# Patient Record
Sex: Female | Born: 1955 | Race: White | Hispanic: No | Marital: Married | Smoking: Former smoker
Health system: Southern US, Community
[De-identification: ages and names within clinical notes are randomized; demographics above are authoritative.]

## PROBLEM LIST (undated history)

## (undated) DIAGNOSIS — R51 Headache: Secondary | ICD-10-CM

## (undated) DIAGNOSIS — G40219 Localization-related (focal) (partial) symptomatic epilepsy and epileptic syndromes with complex partial seizures, intractable, without status epilepticus: Secondary | ICD-10-CM

## (undated) DIAGNOSIS — E079 Disorder of thyroid, unspecified: Secondary | ICD-10-CM

## (undated) DIAGNOSIS — M069 Rheumatoid arthritis, unspecified: Secondary | ICD-10-CM

## (undated) DIAGNOSIS — G40209 Localization-related (focal) (partial) symptomatic epilepsy and epileptic syndromes with complex partial seizures, not intractable, without status epilepticus: Secondary | ICD-10-CM

## (undated) DIAGNOSIS — H04129 Dry eye syndrome of unspecified lacrimal gland: Secondary | ICD-10-CM

## (undated) DIAGNOSIS — M1990 Unspecified osteoarthritis, unspecified site: Secondary | ICD-10-CM

## (undated) DIAGNOSIS — F419 Anxiety disorder, unspecified: Secondary | ICD-10-CM

## (undated) DIAGNOSIS — M329 Systemic lupus erythematosus, unspecified: Secondary | ICD-10-CM

## (undated) DIAGNOSIS — M797 Fibromyalgia: Secondary | ICD-10-CM

## (undated) DIAGNOSIS — F32A Depression, unspecified: Secondary | ICD-10-CM

## (undated) DIAGNOSIS — I10 Essential (primary) hypertension: Secondary | ICD-10-CM

## (undated) DIAGNOSIS — F329 Major depressive disorder, single episode, unspecified: Secondary | ICD-10-CM

## (undated) HISTORY — DX: Major depressive disorder, single episode, unspecified: F32.9

## (undated) HISTORY — DX: Localization-related (focal) (partial) symptomatic epilepsy and epileptic syndromes with complex partial seizures, not intractable, without status epilepticus: G40.209

## (undated) HISTORY — DX: Headache: R51

## (undated) HISTORY — PX: BACK SURGERY: SHX140

## (undated) HISTORY — DX: Essential (primary) hypertension: I10

## (undated) HISTORY — DX: Anxiety disorder, unspecified: F41.9

## (undated) HISTORY — DX: Unspecified osteoarthritis, unspecified site: M19.90

## (undated) HISTORY — DX: Dry eye syndrome of unspecified lacrimal gland: H04.129

## (undated) HISTORY — DX: Rheumatoid arthritis, unspecified: M06.9

## (undated) HISTORY — DX: Depression, unspecified: F32.A

## (undated) HISTORY — PX: THYROID SURGERY: SHX805

## (undated) HISTORY — PX: CATARACT EXTRACTION W/ INTRAOCULAR LENS IMPLANT: SHX1309

## (undated) HISTORY — DX: Localization-related (focal) (partial) symptomatic epilepsy and epileptic syndromes with complex partial seizures, intractable, without status epilepticus: G40.219

## (undated) HISTORY — PX: OTHER SURGICAL HISTORY: SHX169

## (undated) HISTORY — PX: ABDOMINAL HYSTERECTOMY: SHX81

## (undated) HISTORY — PX: EYE SURGERY: SHX253

## (undated) HISTORY — DX: Fibromyalgia: M79.7

## (undated) HISTORY — PX: BRAIN SURGERY: SHX531

## (undated) HISTORY — PX: THYROIDECTOMY: SHX17

---

## 2002-03-30 HISTORY — PX: OTHER SURGICAL HISTORY: SHX169

## 2004-07-31 ENCOUNTER — Ambulatory Visit: Payer: Self-pay | Admitting: Rheumatology

## 2005-12-24 ENCOUNTER — Ambulatory Visit: Payer: Self-pay | Admitting: Neurosurgery

## 2007-01-30 ENCOUNTER — Ambulatory Visit: Payer: Self-pay | Admitting: Obstetrics & Gynecology

## 2007-02-24 ENCOUNTER — Ambulatory Visit: Payer: Self-pay | Admitting: Gynecology

## 2007-02-27 ENCOUNTER — Encounter: Admission: RE | Admit: 2007-02-27 | Discharge: 2007-02-27 | Payer: Self-pay | Admitting: Gynecology

## 2007-05-26 ENCOUNTER — Ambulatory Visit: Payer: Self-pay | Admitting: Gynecology

## 2007-12-29 ENCOUNTER — Ambulatory Visit: Payer: Self-pay | Admitting: Ophthalmology

## 2007-12-29 ENCOUNTER — Other Ambulatory Visit: Payer: Self-pay

## 2008-01-13 ENCOUNTER — Ambulatory Visit: Payer: Self-pay | Admitting: Rheumatology

## 2008-02-03 ENCOUNTER — Ambulatory Visit: Payer: Self-pay | Admitting: Ophthalmology

## 2008-02-09 ENCOUNTER — Ambulatory Visit: Payer: Self-pay | Admitting: Ophthalmology

## 2009-03-23 ENCOUNTER — Ambulatory Visit: Payer: Self-pay | Admitting: Unknown Physician Specialty

## 2009-10-25 ENCOUNTER — Ambulatory Visit: Payer: Self-pay | Admitting: Ophthalmology

## 2009-11-08 ENCOUNTER — Ambulatory Visit: Payer: Self-pay | Admitting: Ophthalmology

## 2010-02-20 ENCOUNTER — Ambulatory Visit: Payer: Self-pay | Admitting: Rheumatology

## 2010-03-07 ENCOUNTER — Ambulatory Visit: Payer: Self-pay | Admitting: Rheumatology

## 2010-05-03 ENCOUNTER — Emergency Department: Payer: Self-pay | Admitting: Emergency Medicine

## 2010-09-10 ENCOUNTER — Observation Stay: Payer: Self-pay | Admitting: Internal Medicine

## 2010-10-27 ENCOUNTER — Ambulatory Visit: Payer: Self-pay | Admitting: Family Medicine

## 2010-10-29 ENCOUNTER — Emergency Department: Payer: Self-pay | Admitting: Emergency Medicine

## 2011-03-16 NOTE — Assessment & Plan Note (Signed)
Ashlee Hanson, Ashlee Hanson               ACCOUNT NO.:  [id number]   MEDICAL RECORD NO.:  [id number]          PATIENT TYPE:  POB   LOCATION:  [facility] at [facility]         FACILITY:  [facility]   PHYSICIAN:  Elsie Lincoln, MD      DATE OF BIRTH:  14-Nov-1955   DATE OF SERVICE:  01/30/2007                                  CLINIC NOTE   The patient is a 55 year old female who is a known patient of Dr.  Mia Creek although that chart is not available, who presents with several  months of pelvic pain, vaginal burning and itchy.  She feels like her  organs are falling out.  Sexual intercourse is not painful.  The  discharge does not have any odor.  She feels like the pain is worse at  bedtime, however, when she lays down, the feeling of the organs falling  out goes away.  She had a hysterectomy many years ago for pelvic organ  prolapse.  She has had some Pap smears since then and all have been  benign.  She never had any ovarian cysts that she is aware of or  sexually transmitted diseases.  She is on her second marriage.  Her  first marriage ended when her spouse died about 10 years ago.  She is  currently sexually active with her husband.   PAST MEDICAL HISTORY:  Hypertension, systemic lupus, epidermoid brain  tumor.   PAST SURGICAL HISTORY:  Caesarean section, hysterectomy, anterior  cervical effusion, and a brain tumor removed.   GYN HISTORY:  One caesarean section, no abnormal Pap smears, no ovarian  cysts, fibroid tumors or sexually transmitted diseases.   FAMILY HISTORY:  Dad and mom have high blood pressure.   MEDICATIONS:  1. Lexapro.  2. Methotrexate.  3. Folic acid.  4. Plaquenil.  5. Calcium.  6. Levoxyl.  7. Hydrochlorothiazide.  8. Ziac.  9. Ranitidine.  10.K-Dur.  11.Tylenol.  12.Ambien.   ALLERGIES:  SULFA, CODEINE, PENICILLIN, BENADRYL, NSAIDS, IBUPROFEN,  SYNTHROID, PEANUTS AND PEANUT OIL.   SYSTEMIC REVIEW:  Easy bruising, night sweats, fatigue, weight gain and  hot  flashes.   SOCIAL HISTORY:  Two caffeinated beverages a day, smokes a half a pack a  day for 30 years.   PHYSICAL EXAMINATION:  GENERAL:  A well-nourished, well-developed, in no  apparent distress.  Blood pressure 126/80, weight 151, temperature 97.3, pulse 65.  ABDOMEN:  Soft, nontender.  GENITALIA:  Tanner 5, vagina atrophic, urethra nontender, mild to  moderate cystocele, moderate rectocele, minimal vaginal volt prolapse,  no masses on the bimanual, on the rectovaginal there is no feeling of  endofascia between the rectum and the vagina.  Also of note, the patient  has been going to a urologist to get her urethra stretched.  This also  happened again 5 years ago when she had some problems with retention.   ASSESSMENT/PLAN:  A 55 year old female with menopause, atrophic  vaginitis and pelvic organ prolapse.  1. Wet prep was negative.  2. Vaginal exam for atropic vaginitis.  3. Return to clinic in a month to see if she is doing better with the      pain once we get  her atrophic vaginitis under control.  I do not      know if she is a candidate for hormones orally given that she has      lupus and a smoker, and she might have an increased risk for clots.  4. The patient is going to see Dr. Mia Creek next time as he is our      pelvic organ prolapse expert and would like him to be involved in      the case if she does choose to have surgery.           ______________________________  Elsie Lincoln, MD     KL/MEDQ  D:  01/30/2007  T:  01/30/2007  Job:  [id number]

## 2011-04-09 ENCOUNTER — Other Ambulatory Visit: Payer: Self-pay | Admitting: Orthopedic Surgery

## 2013-03-18 ENCOUNTER — Ambulatory Visit: Payer: Self-pay | Admitting: Surgery

## 2013-03-25 ENCOUNTER — Ambulatory Visit: Payer: Self-pay | Admitting: Surgery

## 2013-03-25 HISTORY — PX: OTHER SURGICAL HISTORY: SHX169

## 2013-03-26 LAB — PATHOLOGY REPORT

## 2013-08-14 DIAGNOSIS — B351 Tinea unguium: Secondary | ICD-10-CM

## 2013-08-18 ENCOUNTER — Ambulatory Visit: Payer: Self-pay | Admitting: Podiatry

## 2013-11-06 ENCOUNTER — Other Ambulatory Visit: Payer: Self-pay | Admitting: *Deleted

## 2013-11-06 DIAGNOSIS — M79609 Pain in unspecified limb: Secondary | ICD-10-CM

## 2013-11-09 ENCOUNTER — Encounter: Payer: Self-pay | Admitting: Surgery

## 2013-12-04 ENCOUNTER — Encounter: Payer: Self-pay | Admitting: Surgery

## 2013-12-07 ENCOUNTER — Ambulatory Visit (INDEPENDENT_AMBULATORY_CARE_PROVIDER_SITE_OTHER): Payer: BC Managed Care – PPO | Admitting: Surgery

## 2013-12-07 ENCOUNTER — Ambulatory Visit (HOSPITAL_COMMUNITY)
Admission: RE | Admit: 2013-12-07 | Discharge: 2013-12-07 | Disposition: A | Discharge status: Home / Self Care | Payer: BC Managed Care – PPO | Source: Ambulatory Visit | Attending: Surgery | Admitting: Surgery

## 2013-12-07 ENCOUNTER — Encounter: Payer: Self-pay | Admitting: Surgery

## 2013-12-07 VITALS — BP 137/77 | HR 78 | Ht 63.0 in | Wt 152.0 lb

## 2013-12-07 DIAGNOSIS — R209 Unspecified disturbances of skin sensation: Secondary | ICD-10-CM | POA: Insufficient documentation

## 2013-12-07 DIAGNOSIS — M79609 Pain in unspecified limb: Secondary | ICD-10-CM

## 2013-12-07 DIAGNOSIS — Z8249 Family history of ischemic heart disease and other diseases of the circulatory system: Secondary | ICD-10-CM | POA: Insufficient documentation

## 2013-12-07 DIAGNOSIS — M25579 Pain in unspecified ankle and joints of unspecified foot: Secondary | ICD-10-CM | POA: Insufficient documentation

## 2013-12-07 NOTE — Progress Notes (Signed)
Patient name: Ashlee Hanson MRN: [id number] DOB: 1956-01-26 Sex: female   Referred by: Dr. Trudie Reed  Reason for referral:  Chief Complaint  Patient presents with  . New Evaluation    bilateral LE pain c/o bilateral numbness in toes when standing    HISTORY OF PRESENT ILLNESS: This is a very pleasant 58 year old female who is referred today for evaluation of bilateral leg pain.  The patient states that her ankles bother her the most, the left greater than the right.  She describes a constriction like feeling at her ankle that feels as if a rubber band is tightening around her ankle.  She also complains of discomfort that shoots up her leg from the ankle to the thigh.  Her leg bother her when she goes to bed and when she wakes up.  Occasionally she will be awakened in the middle the night with leg pain.  She states that there are no relieving factors even with steroids.  She has also undergone a local injections by her podiatrist that were also of minimal benefit.  The patient has been diagnosed with ankylosing spondylitis as well as lupus and has been undergoing treatment for this.  About 2 years ago she was in a wheelchair but is now able to ambulate on her on which is a significant improvement.  She has a significant family history for peripheral vascular disease.  She has had multiple relatives undergo amputations.  Her father also almost underwent amputation but leg was salvaged with bypass.  She is a former smoker and quit in 2009  Past Medical History  Diagnosis Date  . Depression   . Fibromyalgia   . Arthritis     Past Surgical History  Procedure Laterality Date  . Hemorrhoidopexy  03/25/2013  . Epidermoid brain tumor  03/30/2002    partial removal  . Thyroid surgery      removal  . Cataract extraction w/ intraocular lens implant Bilateral   . Abdominal hysterectomy    . Cesarean section      History   Social History  . Marital Status: Married    Spouse Name: N/A   Number of Children: N/A  . Years of Education: N/A   Occupational History  . Not on file.   Social History Main Topics  . Smoking status: Former Smoker    Types: Cigarettes    Quit date: 12/08/2007  . Smokeless tobacco: Never Used  . Alcohol Use: No  . Drug Use: No  . Sexual Activity: Not on file   Other Topics Concern  . Not on file   Social History Narrative  . No narrative on file    Family History  Problem Relation Age of Onset  . Stroke Mother   . Hyperlipidemia Mother   . Hypertension Mother   . Stroke Father   . Hypertension Father   . Peripheral vascular disease Father     Allergies as of 12/07/2013 - Review Complete 12/07/2013  Allergen Reaction Noted  . Acyclovir and related  11/09/2013  . Arava [leflunomide]  11/10/2013  . Benadryl [diphenhydramine]  11/10/2013  . Celexa [citalopram hydrobromide]  11/10/2013  . Cephalosporins  11/10/2013  . Ciprofloxacin  11/10/2013  . Codeine phosphate  11/10/2013  . Desipramine  11/10/2013  . Dilantin [phenytoin sodium extended]  11/10/2013  . Estradiol  11/10/2013  . Humira [adalimumab]  11/10/2013  . Levaquin [levofloxacin in d5w]  11/10/2013  . Lyrica [pregabalin]  11/10/2013  . Metanx [l-methylfolate-algae-b12-b6]  11/10/2013  . Nexium [esomeprazole magnesium]  11/10/2013  . Nsaids  11/10/2013  . Penicillins  11/10/2013  . Plaquenil [hydroxychloroquine sulfate]  11/10/2013  . Septra [sulfamethoxazole-tmp ds]  11/10/2013  . Synthroid [levothyroxine sodium]  11/10/2013  . Tramadol  11/10/2013    Current Outpatient Prescriptions on File Prior to Visit  Medication Sig Dispense Refill  . acetaminophen (TYLENOL) 500 MG tablet Take 500 mg by mouth every 6 (six) hours as needed.      . ALPRAZolam (XANAX) 0.25 MG tablet Take 0.25 mg by mouth 4 (four) times daily. Take 1/2 tablet qid      . bisoprolol (ZEBETA) 5 MG tablet Take 5 mg by mouth daily. 1/2 tablet daily      . leflunomide (ARAVA) 20 MG tablet Take 20 mg  by mouth daily.      Marland Kitchen levothyroxine (SYNTHROID, LEVOTHROID) 112 MCG tablet Take 112 mcg by mouth daily before breakfast.      . MAGNESIUM PO Take 1 capsule by mouth daily.      . Multiple Vitamin (MULTIVITAMIN) tablet Take 1 tablet by mouth daily.      Marland Kitchen POTASSIUM PO Take 1 tablet by mouth. With each meal, 2 tablets qhs      . predniSONE (DELTASONE) 5 MG tablet Take 5 mg by mouth daily with breakfast.      . Probiotic Product (PROBIOTIC DAILY PO) Take 1 capsule by mouth daily.      . ranitidine (ZANTAC) 300 MG tablet Take 300 mg by mouth 2 (two) times daily.      . Tocilizumab (ACTEMRA) 200 MG/10ML SOLN Inject into the vein.      Marland Kitchen zolpidem (AMBIEN) 10 MG tablet Take 10 mg by mouth at bedtime as needed for sleep.       No current facility-administered medications on file prior to visit.     REVIEW OF SYSTEMS: Cardiovascular: Positive for palpitations, shortness of breath with exertion, pain in legs with walking or lying flat Pulmonary: No productive cough, asthma or wheezing. Neurologic: Positive for numbness in her feet all the time in the tips of her fingers occasionally.  Positive for dizziness Hematologic: No bleeding problems or clotting disorders. Musculoskeletal: No joint pain or joint swelling. Gastrointestinal: No blood in stool or hematemesis Genitourinary: No dysuria or hematuria. Psychiatric:: No history of major depression. Integumentary: No rashes or ulcers. Constitutional: No fever or chills.  PHYSICAL EXAMINATION: General: The patient appears their stated age.  Vital signs are BP 137/77  Pulse 78  Ht 5\' 3"  (1.6 m)  Wt 152 lb (68.947 kg)  BMI 26.93 kg/m2  SpO2 97% HEENT:  No gross abnormalities Pulmonary: Respirations are non-labored Musculoskeletal: There are no major deformities.   Neurologic: No focal weakness or paresthesias are detected, Skin: There are no ulcer or rashes noted. Psychiatric: The patient has normal affect. Cardiovascular: There is a regular  rate and rhythm without significant murmur appreciated.  No carotid bruits.  Palpable radial pulses bilaterally.  Palpable pedal pulses bilaterally  Diagnostic Studies: Ankle-brachial indices are ordered today.  ABIs are 1.2 bilaterally.  Waveforms are biphasic    Assessment:  Bilateral leg pain, left greater than right Plan: I discussed the ultrasound findings today with the patient.  She has essentially normal ankle-brachial indices with palpable pulses.  Therefore, I do not feel that her current symptoms of leg pain are related to arterial insufficiency.  In addition, she does not have significant swelling therefore I do not think a venous pathology is  likely either.  My suspicion is that this is a complication from her underlying connective tissue disease.  The patient has a history of spinal stenosis which has not been evaluated since 2011.  She may benefit from further evaluation or free imaging of this.  In addition she may also benefit from a referral to the pain center to help manage her symptoms.  I discussed the importance of monitoring her for claudication type symptoms which we went over today.  I also told her to contact me again should she develop ulcers on her legs.  Otherwise, I will see her back on an as-needed basis.     Eldridge Abrahams, M.D. [facility] Office: [phone] Pager:  [phone]

## 2014-02-05 ENCOUNTER — Ambulatory Visit: Payer: Self-pay | Admitting: Podiatry

## 2014-02-10 ENCOUNTER — Ambulatory Visit: Payer: Self-pay | Admitting: Urology

## 2014-02-16 ENCOUNTER — Ambulatory Visit (INDEPENDENT_AMBULATORY_CARE_PROVIDER_SITE_OTHER): Payer: BC Managed Care – PPO | Admitting: Podiatry

## 2014-02-16 ENCOUNTER — Encounter: Payer: Self-pay | Admitting: Podiatry

## 2014-02-16 VITALS — Resp 16 | Ht 63.0 in | Wt 147.0 lb

## 2014-02-16 DIAGNOSIS — M779 Enthesopathy, unspecified: Secondary | ICD-10-CM

## 2014-02-16 DIAGNOSIS — M7750 Other enthesopathy of unspecified foot: Secondary | ICD-10-CM

## 2014-02-16 MED ORDER — TRIAMCINOLONE ACETONIDE 10 MG/ML IJ SUSP
10.0000 mg | Freq: Once | INTRAMUSCULAR | Status: AC
  Administered 2014-02-16: 10 mg

## 2014-02-16 NOTE — Progress Notes (Signed)
Subjective:     Patient ID: Ashlee Hanson, female   DOB: 10/24/1956, 58 y.o.   MRN: [id number]  HPI patient presents stating you can give me a shot in my feet and I'm having trouble with my right orthotic   Review of Systems     Objective:   Physical Exam Neurovascular status unchanged with patient well oriented x3 and found to have pain in the dorsum of the right ankle and pain in the second metatarsophalangeal joint of the left foot    Assessment:     Tendinitis dorsal right with capsulitis second MPJ left and needs a valgus wedge on the right orthotic    Plan:     Discussed condition and inject the dorsal ankle right 3 mg Kenalog 5 mg Xylocaine and did a proximal nerve block left and aspirated the joint getting out a small amount of clear fluid injected with a quarter cc of dexamethasone Kenalog

## 2014-03-16 ENCOUNTER — Encounter: Payer: Self-pay | Admitting: Podiatry

## 2015-02-18 NOTE — Op Note (Signed)
PATIENT NAME:  Ashlee Hanson, Ashlee Hanson MR#:  [id number] DATE OF BIRTH:  [birth year]  DATE OF PROCEDURE:  03/25/2013  PREOPERATIVE DIAGNOSIS: Hemorrhoids.   POSTOPERATIVE DIAGNOSIS:  Hemorrhoids.  PROCEDURE PERFORMED:   1.  Rectal exam under anesthesia.  2.  PPH, stapled hemorrhoidectomy.   SURGEON: Rodena Goldmann, MD   ANESTHESIA: General.   OPERATIVE PROCEDURE: With the patient in the supine position after induction of appropriate general anesthesia, the patient was placed in the lithotomy position. The patient was appropriately padded and positioned. Digital examination and bivalve retractor examination revealed a significant number of internal hemorrhoids that were not fully appreciated at the time of her preoperative evaluation. The large bleeding hemorrhoid was easily visualized, but I thought with the degree of hemorrhoid disease that she possessed, it would be more reasonable to do a complete procedure as opposed to individual resection. The area was infiltrated with 20 mL of 0.25% Marcaine to help relax the sphincter. The obturator was then placed into the rectal cavity. It was sutured in place with 2-0 nylon. Using the adapter, a pursestring suture of 2-0 Prolene was placed approximately 2 cm above the last visible hemorrhoid. The obturator was removed. The pursestring appeared to be tied around my finger. The Broadwell device was then brought to the table and inserted through the pursestring with the appropriate pop into the lumen. It was secured in place. The wings of the suture were then passed through the device, using the crochet hook and tied on the outside. The stapler was then approximated, held for 90 seconds prior to firing. The spear was then held for 45 seconds prior to releasing. The specimen was a complete circle. There were several small bleeding points, which were cauterized with the Bovie electrocautery. The area was inspected, and no significant abnormalities were identified. The vaginal  wall had been separate from the [facility] device at the time of closure. Gelfoam and Avitene was inserted into the rectum, and a sterile dressing applied. The patient was returned to the   recovery room, having tolerated the procedure well. Sponge, instrument and needle counts were correct x 2 in the operating room.     ____________________________ Rodena Goldmann III, MD rle:dmm D: 03/25/2013 10:27:00 ET T: 03/25/2013 11:05:06 ET JOB#: [id number]  cc: Rodena Goldmann III, MD, <Dictator> Rodena Goldmann MD ELECTRONICALLY SIGNED 03/28/2013 9:41

## 2015-03-15 ENCOUNTER — Other Ambulatory Visit: Payer: Self-pay | Admitting: Neurosurgery

## 2015-03-15 DIAGNOSIS — D496 Neoplasm of unspecified behavior of brain: Secondary | ICD-10-CM

## 2015-03-24 ENCOUNTER — Ambulatory Visit
Admission: RE | Admit: 2015-03-24 | Discharge: 2015-03-24 | Disposition: A | Discharge status: Home / Self Care | Payer: BLUE CROSS/BLUE SHIELD | Source: Ambulatory Visit | Attending: Neurosurgery | Admitting: Neurosurgery

## 2015-03-24 DIAGNOSIS — D496 Neoplasm of unspecified behavior of brain: Secondary | ICD-10-CM | POA: Diagnosis present

## 2015-03-24 MED ORDER — GADOBENATE DIMEGLUMINE 529 MG/ML IV SOLN
15.0000 mL | Freq: Once | INTRAVENOUS | Status: AC | PRN
  Administered 2015-03-24: 13 mL via INTRAVENOUS

## 2015-04-04 ENCOUNTER — Telehealth: Payer: Self-pay | Admitting: Podiatry

## 2015-04-04 NOTE — Telephone Encounter (Signed)
Pt called asking if she could order another pair of orthotics. She picked up her last ones on 5.2.14. She said her insurance deductible is met until end of month and would like to get them prior to that.Her last office visit was 8.12.14. Please call pt and let her know.

## 2015-04-04 NOTE — Telephone Encounter (Signed)
Patient needs appointment.

## 2015-04-14 ENCOUNTER — Ambulatory Visit (INDEPENDENT_AMBULATORY_CARE_PROVIDER_SITE_OTHER): Payer: BLUE CROSS/BLUE SHIELD

## 2015-04-14 ENCOUNTER — Encounter: Payer: Self-pay | Admitting: Podiatry

## 2015-04-14 ENCOUNTER — Ambulatory Visit (INDEPENDENT_AMBULATORY_CARE_PROVIDER_SITE_OTHER): Payer: BLUE CROSS/BLUE SHIELD | Admitting: Podiatry

## 2015-04-14 VITALS — BP 134/81 | HR 74 | Resp 16

## 2015-04-14 DIAGNOSIS — M1990 Unspecified osteoarthritis, unspecified site: Secondary | ICD-10-CM

## 2015-04-14 DIAGNOSIS — M779 Enthesopathy, unspecified: Secondary | ICD-10-CM

## 2015-04-14 DIAGNOSIS — M79673 Pain in unspecified foot: Secondary | ICD-10-CM

## 2015-04-14 DIAGNOSIS — M2142 Flat foot [pes planus] (acquired), left foot: Secondary | ICD-10-CM

## 2015-04-14 DIAGNOSIS — M2141 Flat foot [pes planus] (acquired), right foot: Secondary | ICD-10-CM | POA: Diagnosis not present

## 2015-04-15 NOTE — Progress Notes (Signed)
Patient ID: Ashlee Hanson, female   DOB: Dec 28, 1955, 59 y.o.   MRN: [id number]  Subjective: 59 year old female presents the office they with complete the bilateral foot and right ankle pain which is been ongoing for several years. She states that she previously had orthotics made in 2014 which seem to help some however they are "falling apart" and she is requesting new orthotics. She has pain to her feet with prolonged ambulation. She does have ankylosing spondylitis and they have increased her medications to see if that will help with the pain, but it has not. She denies any history of injury or trauma and denies any swelling/redness to the area. No tingling or numbness. The pain does not wake her up at night. The pain gets getter with ambulation. No other complaints at this time.   Objective: AAO x3, NAD DP/PT pulses palpable bilaterally, CRT less than 3 seconds Protective sensation intact with Simms Weinstein monofilament, vibratory sensation intact, Achilles tendon reflex intact Nonweightbearing exam reveals tenderness to palpation upon the lateral aspect of the right ankle that her as well as the sinus tarsi area bilaterally. Ankle joint central joint range of motion is intact and without pain and crepitation. Metatarsal MTPJ range of motion is also intact. Equinus is present. Hammertoes are present. No other areas of tenderness to bilateral lower extremities. Weightbearing exam reveals significant decrease in medial arch height with forefoot adduction and mild calcaneal valgus. Gait evaluation reveals excessive pronation without any resupination again and she is going off the medial aspect of the 1st MTPJ.  No areas of tenderness to bilateral lower extremities. MMT 5/5, ROM WNL.  No open lesions or pre-ulcerative lesions.  No overlying edema, erythema, increase in warmth to bilateral lower extremities.  No pain with calf compression, swelling, warmth, erythema bilaterally.    Assessment: 59 year old female with AS and symptomatic flatfoot  Plan: -X-rays were obtained and reviewed with the patient.  -Treatment options discussed including all alternatives, risks, and complications -I do believe the patient would benefit from a new pair orthotics. Upon evaluation the orthotics do not appear to be fitting well within the arch of the foot. She was scanned for orthotics they were sent to [facility] labs. -Discussed that her pain also is likely also coming from the arthritis (although there is no significant arthritic changes on xray) and difficult to manage.  -Follow-up after orthotics or sooner should any problems arise. In the meantime, call the office with any questions/concerns/change in symptoms.

## 2015-05-17 ENCOUNTER — Ambulatory Visit (INDEPENDENT_AMBULATORY_CARE_PROVIDER_SITE_OTHER): Payer: PRIVATE HEALTH INSURANCE | Admitting: *Deleted

## 2015-05-17 DIAGNOSIS — M2142 Flat foot [pes planus] (acquired), left foot: Secondary | ICD-10-CM

## 2015-05-17 DIAGNOSIS — M79673 Pain in unspecified foot: Secondary | ICD-10-CM

## 2015-05-17 DIAGNOSIS — M2141 Flat foot [pes planus] (acquired), right foot: Secondary | ICD-10-CM

## 2015-05-17 NOTE — Patient Instructions (Signed)

## 2015-05-17 NOTE — Progress Notes (Signed)
Orthotics dispensed. Breakin instructions given. Recheck in 1 month as needed.

## 2015-06-07 ENCOUNTER — Ambulatory Visit (INDEPENDENT_AMBULATORY_CARE_PROVIDER_SITE_OTHER): Payer: PRIVATE HEALTH INSURANCE | Admitting: Podiatry

## 2015-06-07 DIAGNOSIS — M1990 Unspecified osteoarthritis, unspecified site: Secondary | ICD-10-CM

## 2015-06-07 DIAGNOSIS — M2142 Flat foot [pes planus] (acquired), left foot: Secondary | ICD-10-CM

## 2015-06-07 DIAGNOSIS — M2141 Flat foot [pes planus] (acquired), right foot: Secondary | ICD-10-CM

## 2015-06-07 NOTE — Progress Notes (Signed)
Patient presents today to pick up orthotics which may send out to [facility]. Upon trying on the orthotic she was very unhappy with orthotics. Because of this we have sent the water back to [facility] as this was the width of that she had previously and did well with. We will send back the orthotics to [facility]. Follow-up once the new orthotics arrive or sooner if any problems are to arise. She denies any acute problems since last appointment.   Reordered orthotics from [facility].

## 2015-06-14 ENCOUNTER — Ambulatory Visit: Payer: PRIVATE HEALTH INSURANCE | Admitting: Podiatry

## 2017-03-13 ENCOUNTER — Other Ambulatory Visit: Payer: Self-pay | Admitting: Rheumatology

## 2017-03-13 DIAGNOSIS — G5701 Lesion of sciatic nerve, right lower limb: Secondary | ICD-10-CM

## 2017-03-21 ENCOUNTER — Ambulatory Visit
Admission: RE | Admit: 2017-03-21 | Discharge: 2017-03-21 | Disposition: A | Discharge status: Home / Self Care | Payer: 59 | Source: Ambulatory Visit | Attending: Rheumatology | Admitting: Rheumatology

## 2017-03-21 DIAGNOSIS — G5701 Lesion of sciatic nerve, right lower limb: Secondary | ICD-10-CM

## 2017-04-06 ENCOUNTER — Emergency Department
Admission: EM | Admit: 2017-04-06 | Discharge: 2017-04-06 | Disposition: A | Discharge status: Home / Self Care | Payer: 59 | Attending: Emergency Medicine | Admitting: Emergency Medicine

## 2017-04-06 ENCOUNTER — Emergency Department: Payer: 59

## 2017-04-06 ENCOUNTER — Encounter: Payer: Self-pay | Admitting: Emergency Medicine

## 2017-04-06 DIAGNOSIS — Z9089 Acquired absence of other organs: Secondary | ICD-10-CM | POA: Insufficient documentation

## 2017-04-06 DIAGNOSIS — R0789 Other chest pain: Secondary | ICD-10-CM

## 2017-04-06 DIAGNOSIS — Z91010 Allergy to peanuts: Secondary | ICD-10-CM | POA: Insufficient documentation

## 2017-04-06 DIAGNOSIS — Z79899 Other long term (current) drug therapy: Secondary | ICD-10-CM | POA: Insufficient documentation

## 2017-04-06 DIAGNOSIS — M791 Myalgia: Secondary | ICD-10-CM | POA: Diagnosis present

## 2017-04-06 DIAGNOSIS — Z8739 Personal history of other diseases of the musculoskeletal system and connective tissue: Secondary | ICD-10-CM | POA: Diagnosis not present

## 2017-04-06 DIAGNOSIS — R52 Pain, unspecified: Secondary | ICD-10-CM

## 2017-04-06 HISTORY — DX: Disorder of thyroid, unspecified: E07.9

## 2017-04-06 HISTORY — DX: Systemic lupus erythematosus, unspecified: M32.9

## 2017-04-06 LAB — BASIC METABOLIC PANEL
ANION GAP: 8 (ref 5–15)
BUN: 16 mg/dL (ref 6–20)
CHLORIDE: 102 mmol/L (ref 101–111)
CO2: 29 mmol/L (ref 22–32)
CREATININE: 0.96 mg/dL (ref 0.44–1.00)
Calcium: 9.2 mg/dL (ref 8.9–10.3)
GFR calc non Af Amer: >60 mL/min (ref >60)
Glucose, Bld: 83 mg/dL (ref 65–99)
POTASSIUM: 4 mmol/L (ref 3.5–5.1)
SODIUM: 139 mmol/L (ref 135–145)

## 2017-04-06 LAB — URINALYSIS, COMPLETE (UACMP) WITH MICROSCOPIC
BACTERIA UA: NONE SEEN
BILIRUBIN URINE: NEGATIVE
Glucose, UA: NEGATIVE mg/dL
HGB URINE DIPSTICK: NEGATIVE
Ketones, ur: NEGATIVE mg/dL
LEUKOCYTES UA: NEGATIVE
NITRITE: NEGATIVE
Protein, ur: NEGATIVE mg/dL
SPECIFIC GRAVITY, URINE: 1.005 (ref 1.005–1.030)
pH: 7 (ref 5.0–8.0)

## 2017-04-06 LAB — CBC
HEMATOCRIT: 44.2 % (ref 35.0–47.0)
HEMOGLOBIN: 15 g/dL (ref 12.0–16.0)
MCH: 32.8 pg (ref 26.0–34.0)
MCHC: 33.9 g/dL (ref 32.0–36.0)
MCV: 96.7 fL (ref 80.0–100.0)
PLATELETS: 209 10*3/uL (ref 150–440)
RBC: 4.56 MIL/uL (ref 3.80–5.20)
RDW: 13.6 % (ref 11.5–14.5)
WBC: 5.9 10*3/uL (ref 3.6–11.0)

## 2017-04-06 LAB — TROPONIN I: Troponin I: <0.03 ng/mL (ref <0.03)

## 2017-04-06 MED ORDER — LORAZEPAM 1 MG PO TABS: 1.0000 mg | ORAL_TABLET | Freq: Two times a day (BID) | ORAL | 0 refills | Status: DC

## 2017-04-06 MED ORDER — OXYCODONE-ACETAMINOPHEN 5-325 MG PO TABS: 1.0000 | ORAL_TABLET | Freq: Four times a day (QID) | ORAL | 0 refills | Status: DC | PRN

## 2017-04-06 MED ORDER — KETOROLAC TROMETHAMINE 30 MG/ML IJ SOLN
30.0000 mg | Freq: Once | INTRAMUSCULAR | Status: AC
  Administered 2017-04-06: 30 mg via INTRAVENOUS
  Filled 2017-04-06: qty 1

## 2017-04-06 MED ORDER — HYDROMORPHONE HCL 1 MG/ML IJ SOLN
0.5000 mg | Freq: Once | INTRAMUSCULAR | Status: AC
  Administered 2017-04-06: 0.5 mg via INTRAVENOUS
  Filled 2017-04-06: qty 1

## 2017-04-06 MED ORDER — LORAZEPAM 2 MG/ML IJ SOLN
1.0000 mg | Freq: Once | INTRAMUSCULAR | Status: AC
  Administered 2017-04-06: 1 mg via INTRAVENOUS
  Filled 2017-04-06: qty 1

## 2017-04-06 NOTE — ED Triage Notes (Signed)
Pt arrives [facility] with c/o chest pain which started approximately 2000 which radiated down her left arm. EMS VS were 90 PR, 170/96, and 100% RA.  Pt denies N/V or any other cardiac symptoms. Pt is alert and oriented x 4 and is exhibiting no neurological symptoms. Pt is tearful in triage but is otherwise in NAD at this time.

## 2017-04-06 NOTE — ED Notes (Signed)
Pt up to BR. Pt able to ambulate with minimal assist.

## 2017-04-06 NOTE — ED Notes (Signed)
Per Jimmye Norman MD, pt no at risk for suicide at this time. Pt clarified to this RN that she wants DNR status. MD Williams aware.

## 2017-04-06 NOTE — ED Provider Notes (Signed)
[facility] Emergency Department Provider Note       Time seen: ----------------------------------------- 8:24 PM on 04/06/2017 -----------------------------------------     I have reviewed the triage vital signs and the nursing notes.   HISTORY   Chief Complaint Chest Pain    HPI Ashlee Hanson is a 61 y.o. female who presents to the ED for diffuse pain. Patient describes 10 out of 10 whole body pain that has been worsening today. Patient has history of arthritis, depression, fibromyalgia, lupus and states pain is been out-of-control today. Earlier it felt like her bra was on too tight and she had significant chest pressure. Patient states if she could just get out of pain she would probably feel better. She denies any recent illness.   Past Medical History:  Diagnosis Date  . Arthritis   . Depression   . Fibromyalgia   . Lupus   . Thyroid disease     Patient Active Problem List   Diagnosis Date Noted  . Pain in limb 12/07/2013    Past Surgical History:  Procedure Laterality Date  . ABDOMINAL HYSTERECTOMY    . BACK SURGERY    . BRAIN SURGERY    . CATARACT EXTRACTION W/ INTRAOCULAR LENS IMPLANT Bilateral   . CESAREAN SECTION    . 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 EYE SURGERY     cataract removal  . hemorrhoidopexy  03/25/2013  . THYROID SURGERY     removal  . THYROIDECTOMY    . urethal diverticulum removal      Allergies Citalopram; Conjugated estrogens; Duloxetine hcl; Eggs or egg-derived products; Estradiol; Gabapentin; Levothyroxine; Nexium [esomeprazole magnesium]; Nsaids; Other; Peanut oil; Rofecoxib; Tape; Arava [leflunomide]; Cephalosporins; Ciprofloxacin; Esomeprazole; Humira [adalimumab]; Iodinated diagnostic agents; Lyrica [pregabalin]; Omeprazole; Phenytoin; Quinolones; Tramadol; Benadryl [diphenhydramine]; Cefuroxime axetil; Celexa [citalopram hydrobromide]; Codeine phosphate; Desipramine; Dilantin  [phenytoin sodium extended]; Escitalopram; Escitalopram oxalate; Estrogens; Famciclovir; Ganciclovir; Hydroxychloroquine; Levaquin [levofloxacin in d5w]; Levofloxacin; Metanx [l-methylfolate-algae-b12-b6]; Penicillins; Plaquenil [hydroxychloroquine sulfate]; Septra [sulfamethoxazole-trimethoprim]; Sulfa antibiotics; Sulfamethoxazole-trimethoprim; Synthroid [levothyroxine sodium]; Tolmetin; Acyclovir; Acyclovir and related; Almond oil; Baclofen; Cefuroxime; Cephalexin; Ciprofloxacin hcl; Codeine; Erythromycin; and Sulfamethoxazole  Social History Social History  Substance Use Topics  . Smoking status: Former Smoker    Types: Cigarettes    Quit date: 12/08/2007  . Smokeless tobacco: Never Used  . Alcohol use No    Review of Systems Constitutional: Negative for fever. Cardiovascular: Positive for chest pain Respiratory: Negative for shortness of breath. Gastrointestinal: Negative for abdominal pain, vomiting and diarrhea. Genitourinary: Negative for dysuria. Musculoskeletal: Positive for diffuse body pain Skin: Negative for rash. Neurological: Negative for headaches, focal weakness or numbness.  All systems negative/normal/unremarkable except as stated in the HPI  ____________________________________________   PHYSICAL EXAM:  VITAL SIGNS: ED Triage Vitals  Enc Vitals Group     BP 04/06/17 2020 (!) 177/114     Pulse Rate 04/06/17 2020 91     Resp 04/06/17 2020 19     Temp 04/06/17 2020 98.4 F (36.9 C)     Temp Source 04/06/17 2020 Oral     SpO2 04/06/17 2020 100 %     Weight 04/06/17 2017 133 lb (60.3 kg)     Height 04/06/17 2017 5\' 3"  (1.6 m)     Head Circumference --      Peak Flow --      Pain Score 04/06/17 2016 10     Pain Loc --      Pain Edu? --  Excl. in [place]? --     Constitutional: Alert and oriented. Markedly anxious, mild distress Eyes: Conjunctivae are normal. Normal extraocular movements. ENT   Head: Normocephalic and atraumatic.   Nose: No  congestion/rhinnorhea.   Mouth/Throat: Mucous membranes are moist.   Neck: No stridor. Cardiovascular: Normal rate, regular rhythm. No murmurs, rubs, or gallops. Respiratory: Normal respiratory effort without tachypnea nor retractions. Breath sounds are clear and equal bilaterally. No wheezes/rales/rhonchi. Gastrointestinal: Soft and nontender. Normal bowel sounds Musculoskeletal: Nontender with normal range of motion in extremities. No lower extremity tenderness nor edema. Neurologic:  Normal speech and language. No gross focal neurologic deficits are appreciated.  Skin:  Skin is warm, dry and intact. No rash noted. Psychiatric: Mood and affect are normal. Speech and behavior are normal.  ____________________________________________  EKG: Interpreted by me. Sinus rhythm rate 86 bpm, normal QRS, normal Q-T. Normal axis.  ____________________________________________  ED COURSE:  Pertinent labs & imaging results that were available during my care of the patient were reviewed by me and considered in my medical decision making (see chart for details). Patient presents for diffuse body pain, we will assess with labs and imaging as indicated.   Procedures ____________________________________________   LABS (pertinent positives/negatives)  Labs Reviewed  BASIC METABOLIC PANEL  CBC  TROPONIN I  URINALYSIS, COMPLETE (UACMP) WITH MICROSCOPIC    RADIOLOGY Images were viewed by me  Chest x-ray IMPRESSION: No radiographic evidence of acute cardiopulmonary disease. IMPRESSION: No acute osseous abnormality.  L4-5 degenerative disc disease, suboptimally evaluated. ____________________________________________  FINAL ASSESSMENT AND PLAN  Generalized pain, chest pain  Plan: Patient's labs and imaging were dictated above. Patient had presented for Generalized pain which is likely multifactorial. There is certainly an anxiety component but she likely had also has some arthritis.  Workup here is been negative, she is stable for outpatient follow-up.   Earleen Newport, MD   Note: This note was generated in part or whole with voice recognition software. Voice recognition is usually quite accurate but there are transcription errors that can and very often do occur. I apologize for any typographical errors that were not detected and corrected.     Earleen Newport, MD 04/06/17 2211

## 2017-04-15 ENCOUNTER — Telehealth: Payer: Self-pay | Admitting: Emergency Medicine

## 2017-04-15 NOTE — Telephone Encounter (Signed)
Patient had left message asking me to call her. She had some questions about AVS.  Says her medication list is not correct.  I explained that the list on AVS says to ask your doctor about most of the meds on the list.  She had a paper list during her visit.  She says she undrestands.

## 2017-07-25 ENCOUNTER — Other Ambulatory Visit: Payer: Self-pay | Admitting: Anesthesiology

## 2017-07-25 DIAGNOSIS — M48061 Spinal stenosis, lumbar region without neurogenic claudication: Secondary | ICD-10-CM

## 2017-08-01 ENCOUNTER — Ambulatory Visit
Admission: RE | Admit: 2017-08-01 | Discharge: 2017-08-01 | Disposition: A | Discharge status: Home / Self Care | Payer: PRIVATE HEALTH INSURANCE | Source: Ambulatory Visit | Attending: Anesthesiology | Admitting: Anesthesiology

## 2017-08-01 DIAGNOSIS — M48061 Spinal stenosis, lumbar region without neurogenic claudication: Secondary | ICD-10-CM | POA: Diagnosis present

## 2017-08-01 DIAGNOSIS — M5126 Other intervertebral disc displacement, lumbar region: Secondary | ICD-10-CM | POA: Diagnosis not present

## 2017-08-01 DIAGNOSIS — M4316 Spondylolisthesis, lumbar region: Secondary | ICD-10-CM | POA: Diagnosis not present

## 2017-09-19 ENCOUNTER — Other Ambulatory Visit: Payer: Self-pay

## 2017-09-19 ENCOUNTER — Encounter: Payer: Self-pay | Admitting: Emergency Medicine

## 2017-09-19 ENCOUNTER — Emergency Department
Admission: EM | Admit: 2017-09-19 | Discharge: 2017-09-19 | Disposition: A | Discharge status: Home / Self Care | Payer: PRIVATE HEALTH INSURANCE | Attending: Emergency Medicine | Admitting: Emergency Medicine

## 2017-09-19 ENCOUNTER — Emergency Department: Payer: PRIVATE HEALTH INSURANCE

## 2017-09-19 DIAGNOSIS — F4329 Adjustment disorder with other symptoms: Secondary | ICD-10-CM | POA: Diagnosis not present

## 2017-09-19 DIAGNOSIS — Z79899 Other long term (current) drug therapy: Secondary | ICD-10-CM | POA: Diagnosis not present

## 2017-09-19 DIAGNOSIS — I10 Essential (primary) hypertension: Secondary | ICD-10-CM

## 2017-09-19 DIAGNOSIS — G8929 Other chronic pain: Secondary | ICD-10-CM | POA: Diagnosis present

## 2017-09-19 DIAGNOSIS — Z87891 Personal history of nicotine dependence: Secondary | ICD-10-CM | POA: Diagnosis not present

## 2017-09-19 LAB — CBC
HCT: 48.7 % — ABNORMAL HIGH (ref 35.0–47.0)
Hemoglobin: 16 g/dL (ref 12.0–16.0)
MCH: 29.3 pg (ref 26.0–34.0)
MCHC: 32.8 g/dL (ref 32.0–36.0)
MCV: 89.4 fL (ref 80.0–100.0)
Platelets: 273 10*3/uL (ref 150–440)
RBC: 5.45 MIL/uL — AB (ref 3.80–5.20)
RDW: 14.2 % (ref 11.5–14.5)
WBC: 13.7 10*3/uL — AB (ref 3.6–11.0)

## 2017-09-19 LAB — COMPREHENSIVE METABOLIC PANEL
ALBUMIN: 3.8 g/dL (ref 3.5–5.0)
ALK PHOS: 106 U/L (ref 38–126)
ALT: 72 U/L — AB (ref 14–54)
AST: 74 U/L — AB (ref 15–41)
Anion gap: 14 (ref 5–15)
BUN: 27 mg/dL — AB (ref 6–20)
CALCIUM: 9.3 mg/dL (ref 8.9–10.3)
CO2: 22 mmol/L (ref 22–32)
CREATININE: 0.95 mg/dL (ref 0.44–1.00)
Chloride: 102 mmol/L (ref 101–111)
GFR calc non Af Amer: >60 mL/min (ref >60)
GLUCOSE: 145 mg/dL — AB (ref 65–99)
Potassium: 4.3 mmol/L (ref 3.5–5.1)
SODIUM: 138 mmol/L (ref 135–145)
Total Bilirubin: 0.6 mg/dL (ref 0.3–1.2)
Total Protein: 7.5 g/dL (ref 6.5–8.1)

## 2017-09-19 LAB — TROPONIN I: Troponin I: <0.03 ng/mL (ref <0.03)

## 2017-09-19 LAB — URINALYSIS, COMPLETE (UACMP) WITH MICROSCOPIC
BILIRUBIN URINE: NEGATIVE
Bacteria, UA: NONE SEEN
GLUCOSE, UA: NEGATIVE mg/dL
KETONES UR: NEGATIVE mg/dL
LEUKOCYTES UA: NEGATIVE
Nitrite: NEGATIVE
PROTEIN: 100 mg/dL — AB
Specific Gravity, Urine: 1.011 (ref 1.005–1.030)
pH: 6 (ref 5.0–8.0)

## 2017-09-19 LAB — SALICYLATE LEVEL: SALICYLATE LVL: 7 mg/dL (ref 2.8–30.0)

## 2017-09-19 LAB — ETHANOL: Alcohol, Ethyl (B): <10 mg/dL (ref <10)

## 2017-09-19 LAB — ACETAMINOPHEN LEVEL: Acetaminophen (Tylenol), Serum: 22 ug/mL (ref 10–30)

## 2017-09-19 MED ORDER — OXYMETAZOLINE HCL 0.05 % NA SOLN
1.0000 | Freq: Once | NASAL | Status: AC
  Administered 2017-09-19: 1 via NASAL
  Filled 2017-09-19: qty 15

## 2017-09-19 MED ORDER — ALPRAZOLAM 0.5 MG PO TABS
0.5000 mg | ORAL_TABLET | Freq: Once | ORAL | Status: AC
Start: 2017-09-19 — End: 2017-09-19
  Administered 2017-09-19: 0.5 mg via ORAL
  Filled 2017-09-19: qty 1

## 2017-09-19 MED ORDER — ALUM & MAG HYDROXIDE-SIMETH 200-200-20 MG/5ML PO SUSP
30.0000 mL | Freq: Once | ORAL | Status: AC
  Administered 2017-09-19: 30 mL via ORAL
  Filled 2017-09-19: qty 30

## 2017-09-19 MED ORDER — ACETAMINOPHEN 500 MG PO TABS
1000.0000 mg | ORAL_TABLET | Freq: Once | ORAL | Status: AC
  Administered 2017-09-19: 1000 mg via ORAL
  Filled 2017-09-19: qty 2

## 2017-09-19 NOTE — ED Provider Notes (Signed)
[facility] Emergency Department Provider Note ____________________________________________   First MD Initiated Contact with Patient 09/19/17 1643     (approximate)  I have reviewed the triage vital signs and the nursing notes.   HISTORY  Chief Complaint Psychiatric Evaluation    HPI Ashlee Hanson is a 61 y.o. female with a history of lupus, rheumatoid arthritis, fibromyalgia, and other past medical history as noted below, who presents with apparent suicidal ideation.  Patient states that she had a mechanical fall because by dishwasher door that was left open, and then when she was on the floor she said something like "God please take me" and states that she has had this multiple times before.  She states that her husband called the sheriff's department on her ever brought to the hospital.  Patient expresses frustration about her multiple chronic medical problems, her chronic pain, and states that after she received a steroid injection for her chronic pain last week, she reports multiple new medical problems including shortness of breath, malaise, decreased urination, and abdominal cramping.  Patient denies any other past psychiatric history, and she states that she is not on psychiatric medications.  She states that this time that she does not want to actively kill herself; she states that she believes she will go to hell if she commit suicide, and states that "my life is hell here on earth."  Past Medical History:  Diagnosis Date  . Arthritis   . Depression   . Fibromyalgia   . Lupus   . Thyroid disease     Patient Active Problem List   Diagnosis Date Noted  . Pain in limb 12/07/2013    Past Surgical History:  Procedure Laterality Date  . ABDOMINAL HYSTERECTOMY    . BACK SURGERY    . BRAIN SURGERY    . CATARACT EXTRACTION W/ INTRAOCULAR LENS IMPLANT Bilateral   . CESAREAN SECTION    . 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  EYE SURGERY     cataract removal  . hemorrhoidopexy  03/25/2013  . THYROID SURGERY     removal  . THYROIDECTOMY    . urethal diverticulum removal      Prior to Admission medications   Medication Sig Start Date End Date Taking? Authorizing Provider  acetaminophen (TYLENOL) 500 MG tablet Take 500 mg by mouth every 6 (six) hours as needed.    [provider]  ALPRAZolam Duanne Moron) 0.25 MG tablet Take 0.25 mg by mouth 4 (four) times daily. Take 1/2 tablet qid    [provider]  bisoprolol (ZEBETA) 5 MG tablet Take 5 mg by mouth daily. 1/2 tablet daily    [provider]  leflunomide (ARAVA) 20 MG tablet Take 20 mg by mouth daily.    [provider]  levothyroxine (SYNTHROID, LEVOTHROID) 112 MCG tablet Take 112 mcg by mouth daily before breakfast.    [provider]  LORazepam (ATIVAN) 1 MG tablet Take 1 tablet (1 mg total) by mouth 2 (two) times daily. 04/06/17 04/06/18  Earleen Newport, MD  MAGNESIUM PO Take 1 capsule by mouth daily.    [provider]  Multiple Vitamin (MULTIVITAMIN) tablet Take 1 tablet by mouth daily.    [provider]  oxyCODONE-acetaminophen (PERCOCET) 5-325 MG tablet Take 1-2 tablets by mouth every 6 (six) hours as needed. 04/06/17   Earleen Newport, MD  POTASSIUM PO Take 1 tablet by mouth. With each meal, 2 tablets qhs  [provider]  predniSONE (DELTASONE) 5 MG tablet Take 5 mg by mouth daily with breakfast.    [provider]  Probiotic Product (PROBIOTIC DAILY PO) Take 1 capsule by mouth daily.    [provider]  ranitidine (ZANTAC) 300 MG tablet Take 300 mg by mouth 2 (two) times daily.    [provider]  Tocilizumab (ACTEMRA IV) Inject into the vein.    [provider]  Tocilizumab (ACTEMRA) 200 MG/10ML SOLN Inject into the vein.    [provider]  zolpidem (AMBIEN) 10 MG tablet Take 10 mg by mouth at bedtime as needed for sleep.     [provider]    Allergies Citalopram; Conjugated estrogens; Duloxetine hcl; Eggs or egg-derived products; Estradiol; Gabapentin; Levothyroxine; Nexium [esomeprazole magnesium]; Nsaids; Other; Peanut oil; Rofecoxib; Tape; Arava [leflunomide]; Cephalosporins; Ciprofloxacin; Esomeprazole; Humira [adalimumab]; Iodinated diagnostic agents; Lyrica [pregabalin]; Omeprazole; Phenytoin; Quinolones; Tramadol; Benadryl [diphenhydramine]; Cefuroxime axetil; Celexa [citalopram hydrobromide]; Codeine phosphate; Desipramine; Dilantin [phenytoin sodium extended]; Escitalopram; Escitalopram oxalate; Estrogens; Famciclovir; Ganciclovir; Hydroxychloroquine; Levaquin [levofloxacin in d5w]; Levofloxacin; Metanx [l-methylfolate-algae-b12-b6]; Penicillins; Plaquenil [hydroxychloroquine sulfate]; Septra [sulfamethoxazole-trimethoprim]; Sulfa antibiotics; Sulfamethoxazole-trimethoprim; Synthroid [levothyroxine sodium]; Tolmetin; Acyclovir; Acyclovir and related; Almond oil; Baclofen; Cefuroxime; Cephalexin; Ciprofloxacin hcl; Codeine; Erythromycin; and Sulfamethoxazole  Family History  Problem Relation Age of Onset  . Stroke Mother   . Hyperlipidemia Mother   . Hypertension Mother   . Stroke Father   . Hypertension Father   . Peripheral vascular disease Father     Social History Social History   Tobacco Use  . Smoking status: Former Smoker    Types: Cigarettes    Last attempt to quit: 12/08/2007    Years since quitting: 9.7  . Smokeless tobacco: Never Used  Substance Use Topics  . Alcohol use: No  . Drug use: No    Review of Systems  Constitutional: For fever. Eyes: No redness. ENT: Positive for throat pain over the last hour after drinking cappuccino. Cardiovascular: Denies chest pain. Respiratory: Positive for shortness of breath. Gastrointestinal: No nausea, no vomiting.  No diarrhea.  Genitourinary: Negative for dysuria.  Musculoskeletal: Positive for chronic back pain. Skin: Negative  for rash. Neurological: Negative for headache.   ____________________________________________   PHYSICAL EXAM:  VITAL SIGNS: ED Triage Vitals  Enc Vitals Group     BP 09/19/17 1618 (!) 230/130     Pulse Rate 09/19/17 1618 (!) 120     Resp 09/19/17 1618 (!) 28     Temp 09/19/17 1618 98.4 F (36.9 C)     Temp Source 09/19/17 1618 Oral     SpO2 09/19/17 1618 98 %     Weight 09/19/17 1619 127 lb (57.6 kg)     Height 09/19/17 1619 5\' 3"  (1.6 m)     Head Circumference --      Peak Flow --      Pain Score 09/19/17 1617 10     Pain Loc --      Pain Edu? --      Excl. in [place]? --     Constitutional: Alert and oriented.  Anxious appearing and intermittently tearful. Eyes: Conjunctivae are normal.  Extraocular movements intact.  PERRLA. Head: Atraumatic. Nose: No congestion/rhinnorhea. Mouth/Throat: Mucous membranes are moist.   Neck: Normal range of motion.  Cardiovascular: Normal rate, regular rhythm. Grossly normal heart sounds.  Good peripheral circulation. Respiratory: Normal respiratory effort.  No retractions. Lungs CTAB. Gastrointestinal: Soft and nontender. No distention.  Genitourinary: No CVA tenderness. Musculoskeletal: No lower extremity edema.  Extremities warm and well perfused.  Neurologic:  Normal speech and language. No gross focal neurologic deficits are appreciated.  Skin:  Skin is warm and dry. No rash noted. Psychiatric: Tearful and anxious appearing. Speech and behavior are normal.  ____________________________________________   LABS (all labs ordered are listed, but only abnormal results are displayed)  Labs Reviewed  COMPREHENSIVE METABOLIC PANEL - Abnormal; Notable for the following components:      Result Value   Glucose, Bld 145 (*)    BUN 27 (*)    AST 74 (*)    ALT 72 (*)    All other components within normal limits  CBC - Abnormal; Notable for the following components:   WBC 13.7 (*)    RBC 5.45 (*)    HCT 48.7 (*)    All other components  within normal limits  URINALYSIS, COMPLETE (UACMP) WITH MICROSCOPIC - Abnormal; Notable for the following components:   Color, Urine YELLOW (*)    APPearance CLEAR (*)    Hgb urine dipstick SMALL (*)    Protein, ur 100 (*)    Squamous Epithelial / LPF 0-5 (*)    All other components within normal limits  ETHANOL  SALICYLATE LEVEL  ACETAMINOPHEN LEVEL  TROPONIN I  URINE DRUG SCREEN, QUALITATIVE ([facility] ONLY)   ____________________________________________  EKG  ED ECG REPORT I, Arta Silence, the attending physician, personally viewed and interpreted this ECG.  Date: 09/19/2017 EKG Time: 1723 Rate: 96 Rhythm: normal sinus rhythm QRS Axis: normal Intervals: normal ST/T Wave abnormalities: normal Narrative Interpretation: no evidence of acute ischemia; no significant change when compared to EKG of 04/06/2017  ____________________________________________  RADIOLOGY  CXR: no focal infiltrate or other acute findings:  XR pelvis: no fracture XR Lspine: no fracture  ____________________________________________   PROCEDURES  Procedure(s) performed: No    Critical Care performed: No ____________________________________________   INITIAL IMPRESSION / ASSESSMENT AND PLAN / ED COURSE  Pertinent labs & imaging results that were available during my care of the patient were reviewed by me and considered in my medical decision making (see chart for details).  61 year old female with past medical history as noted presents under IVC for evaluation of apparent suicidal ideation.  She also reports heartburn-like throat discomfort, shortness of breath, and urinary symptoms over the last several days after getting a steroid injection for chronic pain.  Review of past medical records in epic reveals a visit to the emergency department in June of this year for chronic pain, but no prior mental health encounters.  On exam, patient is significant hypertensive and tachycardic, but  other vital signs are normal.  She is anxious and tearful appearing, but is otherwise cooperative with history, answering questions appropriately, and does not appear altered or acutely psychotic.  The remainder of her physical exam is unremarkable.  1. Psychiatric: Patient denies active SI at this time.  However given her expression of vague suicidal-like thoughts, her despondency of her chronic medical issues, and her acute anxiety, will obtain [facility] evaluation and I will keep patient under IVC until the [facility] evaluation is completed.  2.  Medical: Patient reports multiple symptoms that do not appear related ever since she was given a steroid injection earlier in the week.  However she is significant hypertensive and somewhat tachycardic.  I suspect that this is most likely related to anxiety, and patient states that the hypertension has been chronic for months, but given the shortness of breath and other symptoms, will obtain medical workup including cardiac  enzymes, chest x-ray, EKG, and UA as well as basic labs to rule out hypertensive urgency or other acute medical issue.    ----------------------------------------- 8:29 PM on 09/19/2017 -----------------------------------------  Patient has been evaluated by [facility] and cleared for discharge.  No indication for acute psychiatric treatment.  Patient's medical workup is negative.  He does have a slightly elevated white count, but this is nonspecific; she has no evidence of UTI, no findings on her chest x-ray, no fever or other symptoms of infection.  Her vital signs have improved, and her blood pressure is still elevated but this is chronic.  No indication for further urgency department workup.  I reassessed the patient, and she would like to go home.  Return precautions given, patient expresses understanding.  She will follow-up with her regular doctor within the next week.  ____________________________________________   FINAL CLINICAL IMPRESSION(S)  / ED DIAGNOSES  Final diagnoses:  Adjustment disorder with physical complaints  Hypertension, unspecified type      NEW MEDICATIONS STARTED DURING THIS VISIT:  This SmartLink is deprecated. Use AVSMEDLIST instead to display the medication list for a patient.   Note:  This document was prepared using Dragon voice recognition software and may include unintentional dictation errors.    Arta Silence, MD 09/19/17 2031

## 2017-09-19 NOTE — ED Notes (Signed)
[facility] SOC called

## 2017-09-19 NOTE — ED Notes (Signed)
SOC report given pre-consult

## 2017-09-19 NOTE — ED Triage Notes (Signed)
Patient arrives on IVC. Has been in pain and per paperwork told husband she wanted to kill herself and asked him to help her. Fall today landing on back. History of chronic pain.

## 2017-09-19 NOTE — ED Notes (Signed)
Patient's husband contacted to come pick patient up.  While typing this message patient states she wants to be dc to lobby and doesn't want anything to do with her husband.  Patient's husband notified of her wishes.

## 2017-09-19 NOTE — Discharge Instructions (Signed)
Follow-up with your regular doctor and continue to take your normal medications.  Return to the ER for new or worsening high blood pressure, severe headache, worsening back pain, weakness or numbness, difficulty walking, urinary symptoms, or any other new or worsening symptoms that concern you.

## 2017-09-19 NOTE — ED Notes (Signed)
Patient states she does not wish to talk to any family or have any medical information released to anyone at this time.
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
## 2017-09-19 NOTE — ED Triage Notes (Signed)
Patient is anxious, crying and yelling, hyperventilation and flight of ideas on arrival. Does state during triage she is ready to end her life but that she had not done anything to harm herself to this point.
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
## 2017-09-19 NOTE — ED Notes (Signed)
Called [facility] for consult   [phone]

## 2017-12-18 ENCOUNTER — Other Ambulatory Visit: Payer: Self-pay | Admitting: Anesthesiology

## 2017-12-18 DIAGNOSIS — G4452 New daily persistent headache (NDPH): Secondary | ICD-10-CM

## 2017-12-31 ENCOUNTER — Ambulatory Visit
Admission: RE | Admit: 2017-12-31 | Discharge: 2017-12-31 | Disposition: A | Discharge status: Home / Self Care | Payer: PRIVATE HEALTH INSURANCE | Source: Ambulatory Visit | Attending: Anesthesiology | Admitting: Anesthesiology

## 2017-12-31 DIAGNOSIS — G4452 New daily persistent headache (NDPH): Secondary | ICD-10-CM | POA: Insufficient documentation

## 2017-12-31 DIAGNOSIS — M329 Systemic lupus erythematosus, unspecified: Secondary | ICD-10-CM | POA: Insufficient documentation

## 2017-12-31 DIAGNOSIS — Z9889 Other specified postprocedural states: Secondary | ICD-10-CM | POA: Insufficient documentation

## 2017-12-31 LAB — POCT I-STAT CREATININE: Creatinine, Ser: 0.9 mg/dL (ref 0.44–1.00)

## 2017-12-31 MED ORDER — GADOBENATE DIMEGLUMINE 529 MG/ML IV SOLN
10.0000 mL | Freq: Once | INTRAVENOUS | Status: AC | PRN
  Administered 2017-12-31: 10 mL via INTRAVENOUS

## 2018-02-03 ENCOUNTER — Encounter: Payer: Self-pay | Admitting: Neurology

## 2018-02-03 ENCOUNTER — Ambulatory Visit (INDEPENDENT_AMBULATORY_CARE_PROVIDER_SITE_OTHER): Payer: PRIVATE HEALTH INSURANCE | Admitting: Neurology

## 2018-02-03 ENCOUNTER — Other Ambulatory Visit: Payer: Self-pay

## 2018-02-03 ENCOUNTER — Encounter (INDEPENDENT_AMBULATORY_CARE_PROVIDER_SITE_OTHER): Payer: Self-pay

## 2018-02-03 VITALS — BP 150/83 | HR 82 | Ht 63.0 in | Wt 133.5 lb

## 2018-02-03 DIAGNOSIS — G40209 Localization-related (focal) (partial) symptomatic epilepsy and epileptic syndromes with complex partial seizures, not intractable, without status epilepticus: Secondary | ICD-10-CM

## 2018-02-03 DIAGNOSIS — G441 Vascular headache, not elsewhere classified: Secondary | ICD-10-CM | POA: Diagnosis not present

## 2018-02-03 DIAGNOSIS — Z5181 Encounter for therapeutic drug level monitoring: Secondary | ICD-10-CM

## 2018-02-03 DIAGNOSIS — R519 Headache, unspecified: Secondary | ICD-10-CM | POA: Insufficient documentation

## 2018-02-03 DIAGNOSIS — R51 Headache: Secondary | ICD-10-CM

## 2018-02-03 HISTORY — DX: Localization-related (focal) (partial) symptomatic epilepsy and epileptic syndromes with complex partial seizures, not intractable, without status epilepticus: G40.209

## 2018-02-03 HISTORY — DX: Headache, unspecified: R51.9

## 2018-02-03 MED ORDER — DIVALPROEX SODIUM 125 MG PO DR TAB: DELAYED_RELEASE_TABLET | ORAL | 3 refills | Status: AC

## 2018-02-03 NOTE — Progress Notes (Signed)
Reason for visit: Headaches, seizures  Referring physician: Dr. Brynda Rim is a 62 y.o. female  History of present illness:  Ms. 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 Tommi Rumps at [facility].  The patient had some seizure events following this, she has had allergies to quite a number of medications including Dilantin, Keppra, Lamictal, Lyrica, and gabapentin.  The patient has not been on any medication for seizures, she has had 5 seizures within the last year.  The last seizure she had was on November 01, 2017.  The patient claims that the reason for her seizures was secondary to coming off of alprazolam.  The patient continues to operate a motor vehicle.  Her seizures are associated with a sensation of nausea prior to onset, then the patient will go into a stare, smack her lips, and fumble with the right hand.  The patient does not have full convulsive seizures.  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  The patient also indicates that she bumped her head in November 2018.  The patient had used her foot to close the dishwasher door, and then she fell backwards striking the back of her head on a cabinet.  The patient did not go to the doctor for this.  She did not have headaches until about a week later and then she began having left frontotemporal headaches that would come and go.  The headaches have become less frequent and less severe as times gone on, currently occurring once a week on average.  The headaches may last anywhere from 3 minutes to 30 minutes and then go away.  She indicates that the onset and the offset of the headache are relatively sudden.  The patient may wake up from sleep with a headache.   The patient did have severe migraine headaches when she was in her 98s, she has not had any migraines recently.  The patient indicates that these headaches are different from her usual migraine.  The patient reports no visual disturbance, no nausea or vomiting, she denies any numbness or weakness of the face, arms, or legs.  She does have some bladder control issues, she denies issues controlling the bowels.  She does report episodes of irritability that will come and go that may be quite significant.  The patient denies any neck discomfort or pain coming up from the back of the head.  She is followed by Dr. Maryjean Ka for her chronic low back pain, she gets injections intermittently for this.  She is sent to this office for the above problems.  Past Medical History:  Diagnosis Date  . Anxiety   . Arthritis   . Depression   . Depression   . Fibromyalgia   . Lupus ([place])   . Thyroid disease     Past Surgical History:  Procedure Laterality Date  . ABDOMINAL HYSTERECTOMY    . BACK SURGERY    . BRAIN SURGERY    . CATARACT EXTRACTION W/ INTRAOCULAR LENS IMPLANT Bilateral   . CESAREAN SECTION    . 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 EYE SURGERY     cataract removal  . hemorrhoidopexy  03/25/2013  . THYROID SURGERY  removal  . THYROIDECTOMY    . urethal diverticulum removal      Family History  Problem Relation Age of Onset  . Stroke Mother   . Hyperlipidemia Mother   . Hypertension Mother   . Stroke Father   . Hypertension Father   . Peripheral vascular disease Father     Social history:  reports that she quit smoking about 10 years ago. Her smoking use included cigarettes. She has never used smokeless tobacco. She reports that she does not drink alcohol or use drugs.  Medications:  Prior to Admission medications   Medication Sig Start Date End Date Taking? Authorizing Provider  acetaminophen (TYLENOL) 500 MG tablet Take 500 mg by mouth every 6 (six) hours  as needed.    [provider]  ALPRAZolam Duanne Moron) 0.25 MG tablet Take 0.25 mg by mouth 4 (four) times daily. Take 1/2 tablet qid    [provider]  bisoprolol (ZEBETA) 5 MG tablet Take 5 mg by mouth daily. 1/2 tablet daily    [provider]  leflunomide (ARAVA) 20 MG tablet Take 20 mg by mouth daily.    [provider]  levothyroxine (SYNTHROID, LEVOTHROID) 112 MCG tablet Take 112 mcg by mouth daily before breakfast.    [provider]  LORazepam (ATIVAN) 1 MG tablet Take 1 tablet (1 mg total) by mouth 2 (two) times daily. 04/06/17 04/06/18  Earleen Newport, MD  MAGNESIUM PO Take 1 capsule by mouth daily.    [provider]  Multiple Vitamin (MULTIVITAMIN) tablet Take 1 tablet by mouth daily.    [provider]  oxyCODONE-acetaminophen (PERCOCET) 5-325 MG tablet Take 1-2 tablets by mouth every 6 (six) hours as needed. 04/06/17   Earleen Newport, MD  POTASSIUM PO Take 1 tablet by mouth. With each meal, 2 tablets qhs    [provider]  predniSONE (DELTASONE) 5 MG tablet Take 5 mg by mouth daily with breakfast.    [provider]  Probiotic Product (PROBIOTIC DAILY PO) Take 1 capsule by mouth daily.    [provider]  ranitidine (ZANTAC) 300 MG tablet Take 300 mg by mouth 2 (two) times daily.    [provider]  Tocilizumab (ACTEMRA IV) Inject into the vein.    [provider]  Tocilizumab (ACTEMRA) 200 MG/10ML SOLN Inject into the vein.    [provider]  zolpidem (AMBIEN) 10 MG tablet Take 10 mg by mouth at bedtime as needed for sleep.    [provider]      Allergies  Allergen Reactions  . Citalopram Swelling, Rash and Other (See Comments)    Angioedema, dizzy Dizzy, choking sensation   . Conjugated Estrogens Anaphylaxis  . Duloxetine Hcl Shortness Of Breath, Palpitations, Rash and Other (See Comments)    tremor   . Estradiol Anaphylaxis and Swelling     Lip/tongue swelling Other reaction(s): SWELLING Lip/tongue swelling  . Gabapentin Swelling, Rash and Anaphylaxis    Other Reaction: Fatigue, dizzy Other reaction(s): SWELLING Other reaction(s): SWELLING  . Levothyroxine Shortness Of Breath and Palpitations  . Nexium [Esomeprazole Magnesium] Anaphylaxis    Chocking sensation  . Nsaids Swelling    Swollen lips, throat  . Peanut Oil Anaphylaxis  . Rofecoxib Anaphylaxis and Swelling  . Tape Anaphylaxis, Nausea And Vomiting and Rash    Uncoded Allergy. Allergen: BENEDRYL, Other Reaction: MAKES REACTION WORSE  . Arava [Leflunomide] Rash    Tingling/swelling of feet Other Reaction: Other reaction Tingling/swelling of feet  .  Cephalosporins Nausea And Vomiting    Extreme vomiting    . Ciprofloxacin Swelling    Extreme tendonitis MUSCLE PAIN    . Esomeprazole Swelling, Rash and Other (See Comments)    Chocking sensation, Tightness in chest   . Humira [Adalimumab] Swelling    UTI, nausea, constipation UTI, nausea, constipation  . Lyrica [Pregabalin] Anxiety and Rash    Ext confusion/agitaion Other reaction(s): OTHER Other Reaction: Confusion, agitation Ext confusion/agitaion  . Omeprazole Rash    Other Reaction: hypotension  . Phenytoin Rash and Other (See Comments)    Myalgias and tinnitus "Extreme muscle reaction" Other reaction(s): MUSCLE PAIN  . Quinolones Rash    Other Reaction: Myalgias  . Tramadol Nausea Only and Rash       . Benadryl [Diphenhydramine] Nausea And Vomiting    "made allergic reaction worse" "made allergic reaction worse"  . Cefuroxime Axetil Nausea And Vomiting  . Celexa [Citalopram Hydrobromide]     Dizzy, choking sensation  . Codeine Phosphate Nausea And Vomiting    Patient can take small doses without vomiting. Large doses cause vomiting.  . Desipramine     "Extreme muscle reaction" Other reaction(s): MUSCLE PAIN "Extreme muscle reaction"  . Dilantin [Phenytoin Sodium Extended]      "Extreme muscle reaction"  . Escitalopram   . Escitalopram Oxalate Nausea And Vomiting  . Estrogens     Other reaction(s): ANAPHYLAXIS  . Famciclovir     "felt sick all over"  . Ganciclovir     "felt sick all over"  . Hydroxychloroquine     Tingling in hands/feet  . Levaquin [Levofloxacin In D5w]     Pain joints/muscles  . Levofloxacin     Pain joints/muscles Other reaction(s): MUSCLE PAIN  . Metanx [L-Methylfolate-Algae-B12-B6]     Cause chest pain and rapid heart rate and palpatations  . Penicillins Nausea And Vomiting    Unsure  Other reaction(s): UNKNOWN Unsure   . Septra [Sulfamethoxazole-Trimethoprim]     unsure  . Sulfa Antibiotics Nausea And Vomiting  . Sulfamethoxazole-Trimethoprim     unsure  . Synthroid [Levothyroxine Sodium]     Palpitations.  . Tolmetin     Swollen lips, throat  . Acyclovir Rash    Other Reaction: Lupus flare  . Acyclovir And Related Nausea Only    "felt sick all over"  . Almond Oil Rash    Per blood test at ENT  . Baclofen Rash    Other reaction(s): Other (See Comments)  . Cefuroxime Nausea And Vomiting  . Cephalexin Nausea And Vomiting    Other reaction(s): NAUSEA  . Ciprofloxacin Hcl Rash    tendonitis  . Codeine Rash and Nausea And Vomiting    Unsure of reaction  . Erythromycin Nausea Only and Rash  . Sulfamethoxazole Rash    ROS:  Out of a complete 14 system review of symptoms, the patient complains only of the following symptoms, and all other reviewed systems are negative.  Fatigue Ringing in the ears, difficulty swallowing Blurred vision Easy bruising Feeling hot, cold, flushing Joint pain, aching muscles Headache, difficulty swallowing, seizures Depression, anxiety, none of sleep, decreased energy Insomnia  Blood pressure (!) 150/83, pulse 82, height 5\' 3"  (1.6 m), weight 133 lb 8 oz (60.6 kg).  Physical Exam  General: The patient is alert and cooperative at the time of the examination.  Eyes: Pupils are  equal, round, and reactive to light. Discs are flat bilaterally.  Neck: The neck is supple, no carotid bruits are noted.  Respiratory: The respiratory examination is clear.  Cardiovascular: The cardiovascular examination reveals a regular rate and rhythm, no obvious murmurs or rubs are noted.  Skin: Extremities are without significant edema.  Neurologic Exam  Mental status: The patient is alert and oriented x 3 at the time of the examination. The patient has apparent normal recent and remote memory, with an apparently normal attention span and concentration ability.  Cranial nerves: Facial symmetry is present. There is good sensation of the face to pinprick and soft touch bilaterally. The strength of the facial muscles and the muscles to head turning and shoulder shrug are normal bilaterally. Speech is well enunciated, no aphasia or dysarthria is noted. Extraocular movements are full. Visual fields are full. The tongue is midline, and the patient has symmetric elevation of the soft palate. No obvious hearing deficits are noted.  Motor: The motor testing reveals 5 over 5 strength of all 4 extremities. Good symmetric motor tone is noted throughout.  Sensory: Sensory testing is intact to pinprick, soft touch, vibration sensation, and position sense on all 4 extremities. No evidence of extinction is noted.  Coordination: Cerebellar testing reveals good finger-nose-finger and heel-to-shin bilaterally.  Gait and station: Gait is normal. Tandem gait is normal. Romberg is negative. No drift is seen.  Reflexes: Deep tendon reflexes are symmetric and normal bilaterally. Toes are downgoing bilaterally.   MRI brain 12/31/17:  IMPRESSION: Postop tumor resection left temporal lobe. Stable appearance from the prior study without recurrent tumor  No acute infarct  Mild periventricular white matter hyperintensities have progressed since 2016. Patient history of lupus and this could be related  to lupus. Chronic microvascular ischemia, demyelinating disease, and vasculitis are possible.  * MRI scan images were reviewed online. I agree with the written report.    Assessment/Plan:  1.  Partial complex seizures  2.  Headache, possible trauma triggered migraine  3.  Chronic low back pain  4.  Left dermoid resection, left anterior temporal area  The MRI of the brain does show encephalomalacia in the left anterior temporal region.  This is her likely focus for her seizures.  The patient is currently not being treated for the seizures, her last seizure was in January 2019.  I have recommended that she not operate a motor vehicle for 6 months following the last seizure event.  We will start Depakote for her seizures, migraine headache, and for her mood instability.  The patient has an impressive list of medication allergies, she is already convinced that she is not going to be able to tolerate Depakote.  We will start in a pediatric dose.  The patient is quite irritated that she was told not to drive.  We will check blood work today, she has a history of elevated liver enzymes, she is being treated for rheumatoid arthritis and lupus and ankylosing spondylitis by Dr. Trudie Reed.  The patient will follow-up in 4 months.  Jill Alexanders MD 02/03/2018 9:16 AM  [facility] [address] [place], [place] [address]  Phone [phone] Fax [phone]

## 2018-02-03 NOTE — Patient Instructions (Signed)
   We will start Depakote for the headaches and seizures.  Depakote (valproic acid) is a seizure medication that also has an FDA approval for migraine headache. The most common potential side effects of this medication include weight gain, tremor, or possible stomach upset. This medication can potentially cause liver problems. If confusion is noted on this medication, contact our office immediately.

## 2018-02-04 ENCOUNTER — Telehealth: Payer: Self-pay | Admitting: *Deleted

## 2018-02-04 LAB — CBC WITH DIFFERENTIAL/PLATELET
BASOS: 1 %
Basophils Absolute: 0.1 10*3/uL (ref 0.0–0.2)
EOS (ABSOLUTE): 0.3 10*3/uL (ref 0.0–0.4)
EOS: 3 %
HEMATOCRIT: 42.7 % (ref 34.0–46.6)
Hemoglobin: 14.5 g/dL (ref 11.1–15.9)
Immature Grans (Abs): 0.1 10*3/uL (ref 0.0–0.1)
Immature Granulocytes: 1 %
LYMPHS ABS: 2.4 10*3/uL (ref 0.7–3.1)
Lymphs: 23 %
MCH: 32 pg (ref 26.6–33.0)
MCHC: 34 g/dL (ref 31.5–35.7)
MCV: 94 fL (ref 79–97)
MONOS ABS: 0.7 10*3/uL (ref 0.1–0.9)
Monocytes: 6 %
Neutrophils Absolute: 7.1 10*3/uL — ABNORMAL HIGH (ref 1.4–7.0)
Neutrophils: 66 %
Platelets: 218 10*3/uL (ref 150–379)
RBC: 4.53 x10E6/uL (ref 3.77–5.28)
RDW: 12.9 % (ref 12.3–15.4)
WBC: 10.8 10*3/uL (ref 3.4–10.8)

## 2018-02-04 LAB — COMPREHENSIVE METABOLIC PANEL
ALBUMIN: 4.4 g/dL (ref 3.6–4.8)
ALT: 39 IU/L — ABNORMAL HIGH (ref 0–32)
AST: 31 IU/L (ref 0–40)
Albumin/Globulin Ratio: 1.7 (ref 1.2–2.2)
Alkaline Phosphatase: 89 IU/L (ref 39–117)
BUN / CREAT RATIO: 21 (ref 12–28)
BUN: 20 mg/dL (ref 8–27)
Bilirubin Total: 0.2 mg/dL (ref 0.0–1.2)
CO2: 22 mmol/L (ref 20–29)
CREATININE: 0.97 mg/dL (ref 0.57–1.00)
Calcium: 10.4 mg/dL — ABNORMAL HIGH (ref 8.7–10.3)
Chloride: 102 mmol/L (ref 96–106)
GFR calc Af Amer: 72 mL/min/{1.73_m2} (ref >59)
GFR, EST NON AFRICAN AMERICAN: 63 mL/min/{1.73_m2} (ref >59)
GLOBULIN, TOTAL: 2.6 g/dL (ref 1.5–4.5)
Glucose: 78 mg/dL (ref 65–99)
Potassium: 4.6 mmol/L (ref 3.5–5.2)
SODIUM: 143 mmol/L (ref 134–144)
Total Protein: 7 g/dL (ref 6.0–8.5)

## 2018-02-04 LAB — SEDIMENTATION RATE: Sed Rate: 12 mm/hr (ref 0–40)

## 2018-02-04 NOTE — Telephone Encounter (Signed)
Called and spoke with patient about lab results per CW,MD note. Advised calcium level minimally elevated at 10.4 and ALT slightly elevated at 39 (referance range 0-32) but has improved from prior lab work completed 4 months ago. She verbalized understanding and appreciation for call.

## 2018-02-04 NOTE — Telephone Encounter (Signed)
-----   Message from Kathrynn Ducking, MD sent at 02/04/2018  7:17 AM EDT ----- Blood work is unremarkable with exception of a minimal elevation of the calcium level and slight elevation of the ALT level, but this is improved from prior blood work 4 months ago. No clinical concerns. Please call the patient. ----- Message ----- From: Lavone Neri Lab Results In Sent: 02/04/2018   5:41 AM To: Kathrynn Ducking, MD

## 2018-06-05 ENCOUNTER — Ambulatory Visit: Payer: PRIVATE HEALTH INSURANCE | Admitting: Neurology

## 2019-04-28 IMAGING — DX DG CHEST 1V PORT
1 series · 1 of 1 positions shown · non-contrast
Comparison: Chest x-ray 08/27/2011.

CLINICAL DATA: 61-year-old female with history of chest pain
radiating down into the left arm.

EXAM:
PORTABLE CHEST 1 VIEW

[chest ap]
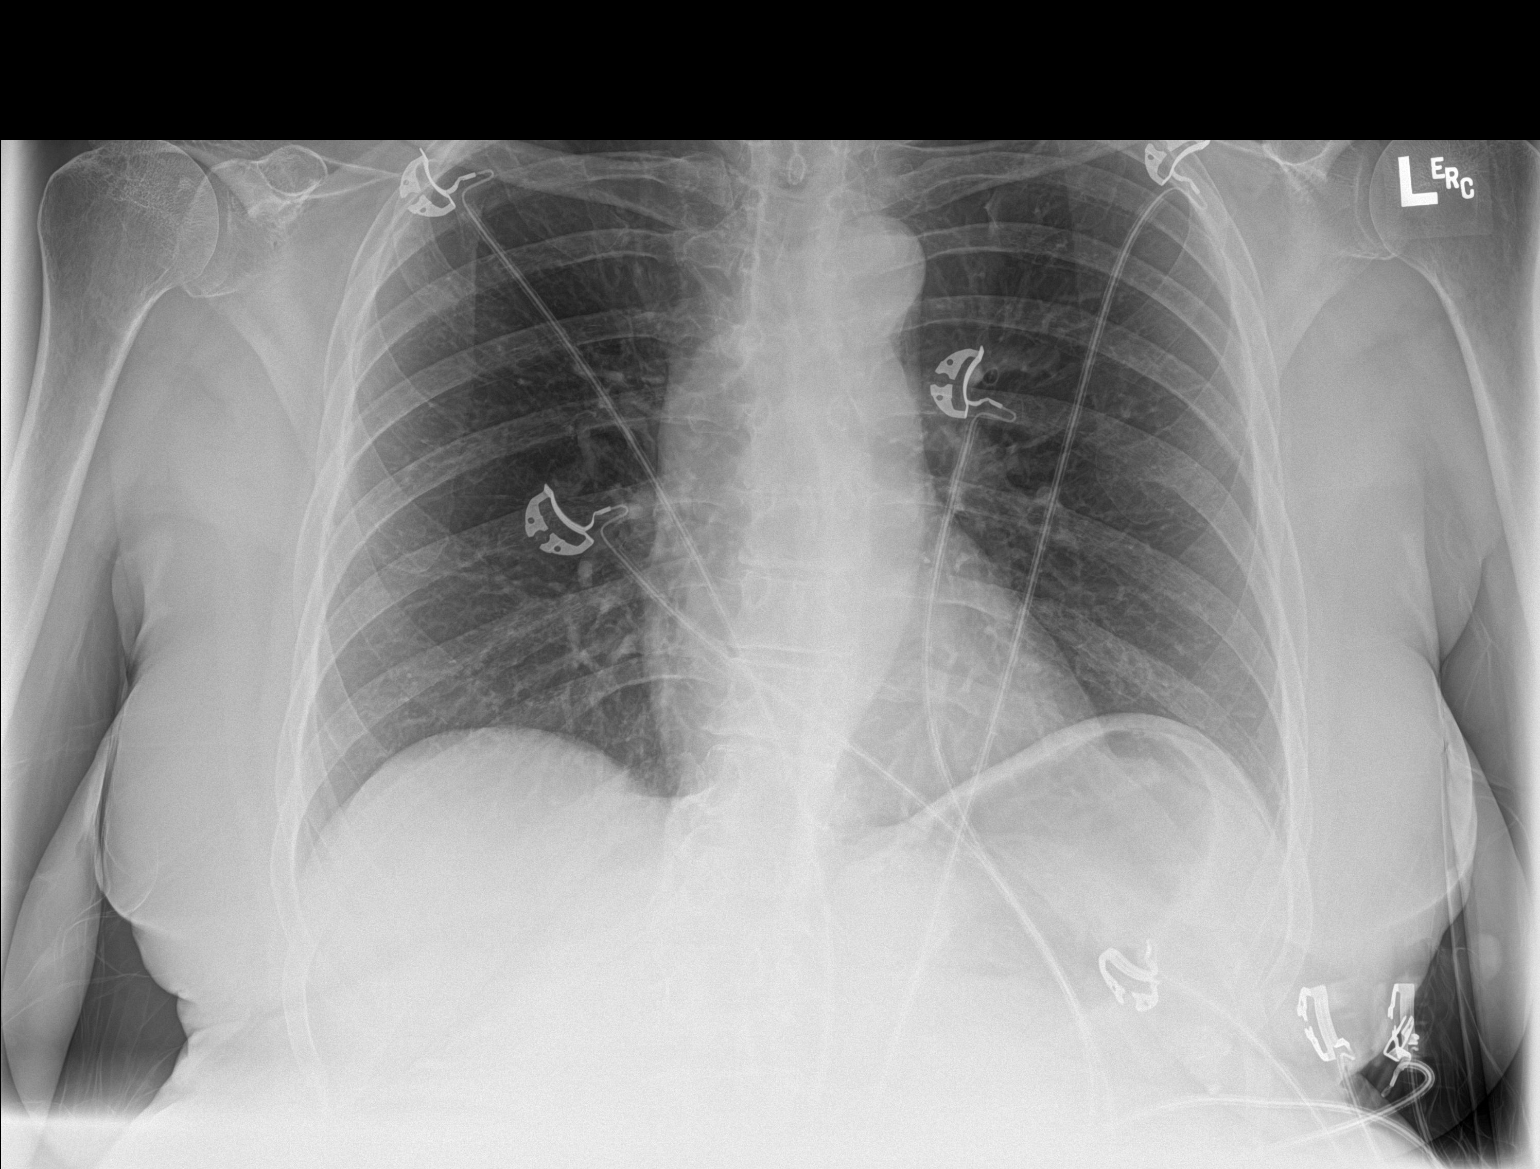

[1 of 1 positions shown; findings below may reference images not displayed]

FINDINGS: Lung volumes are normal. No consolidative airspace disease. No
pleural effusions. No pneumothorax. No pulmonary nodule or mass
noted. Pulmonary vasculature and the cardiomediastinal silhouette
are within normal limits.
IMPRESSION: No radiographic evidence of acute cardiopulmonary disease.

## 2019-08-31 ENCOUNTER — Emergency Department: Payer: PRIVATE HEALTH INSURANCE

## 2019-08-31 ENCOUNTER — Emergency Department
Admission: EM | Admit: 2019-08-31 | Discharge: 2019-09-29 | Disposition: E | Payer: PRIVATE HEALTH INSURANCE | Attending: Emergency Medicine | Admitting: Emergency Medicine

## 2019-08-31 ENCOUNTER — Other Ambulatory Visit: Payer: Self-pay

## 2019-08-31 ENCOUNTER — Encounter: Payer: Self-pay | Admitting: Emergency Medicine

## 2019-08-31 DIAGNOSIS — Z7982 Long term (current) use of aspirin: Secondary | ICD-10-CM | POA: Diagnosis not present

## 2019-08-31 DIAGNOSIS — Z79899 Other long term (current) drug therapy: Secondary | ICD-10-CM | POA: Diagnosis not present

## 2019-08-31 DIAGNOSIS — E875 Hyperkalemia: Secondary | ICD-10-CM | POA: Insufficient documentation

## 2019-08-31 DIAGNOSIS — I10 Essential (primary) hypertension: Secondary | ICD-10-CM | POA: Insufficient documentation

## 2019-08-31 DIAGNOSIS — I469 Cardiac arrest, cause unspecified: Secondary | ICD-10-CM

## 2019-08-31 DIAGNOSIS — T6592XA Toxic effect of unspecified substance, intentional self-harm, initial encounter: Secondary | ICD-10-CM | POA: Insufficient documentation

## 2019-08-31 DIAGNOSIS — Z91010 Allergy to peanuts: Secondary | ICD-10-CM | POA: Diagnosis not present

## 2019-08-31 DIAGNOSIS — Z20828 Contact with and (suspected) exposure to other viral communicable diseases: Secondary | ICD-10-CM | POA: Diagnosis not present

## 2019-08-31 DIAGNOSIS — Z87891 Personal history of nicotine dependence: Secondary | ICD-10-CM | POA: Diagnosis not present

## 2019-08-31 DIAGNOSIS — R464 Slowness and poor responsiveness: Secondary | ICD-10-CM | POA: Diagnosis present

## 2019-08-31 LAB — URINE DRUG SCREEN, QUALITATIVE (ARMC ONLY)
Amphetamines, Ur Screen: NOT DETECTED
Barbiturates, Ur Screen: NOT DETECTED
Benzodiazepine, Ur Scrn: POSITIVE — AB
Cannabinoid 50 Ng, Ur ~~LOC~~: NOT DETECTED
Cocaine Metabolite,Ur ~~LOC~~: NOT DETECTED
MDMA (Ecstasy)Ur Screen: NOT DETECTED
Methadone Scn, Ur: NOT DETECTED
Opiate, Ur Screen: NOT DETECTED
Phencyclidine (PCP) Ur S: NOT DETECTED
Tricyclic, Ur Screen: NOT DETECTED

## 2019-08-31 LAB — CBC WITH DIFFERENTIAL/PLATELET
Abs Immature Granulocytes: 0.34 10*3/uL — ABNORMAL HIGH (ref 0.00–0.07)
Basophils Absolute: 0.1 10*3/uL (ref 0.0–0.1)
Basophils Relative: 0 %
Eosinophils Absolute: 0.9 10*3/uL — ABNORMAL HIGH (ref 0.0–0.5)
Eosinophils Relative: 5 %
HCT: 44 % (ref 36.0–46.0)
Hemoglobin: 13.5 g/dL (ref 12.0–15.0)
Immature Granulocytes: 2 %
Lymphocytes Relative: 33 %
Lymphs Abs: 6.1 10*3/uL — ABNORMAL HIGH (ref 0.7–4.0)
MCH: 31.2 pg (ref 26.0–34.0)
MCHC: 30.7 g/dL (ref 30.0–36.0)
MCV: 101.6 fL — ABNORMAL HIGH (ref 80.0–100.0)
Monocytes Absolute: 0.7 10*3/uL (ref 0.1–1.0)
Monocytes Relative: 4 %
Neutro Abs: 10.4 10*3/uL — ABNORMAL HIGH (ref 1.7–7.7)
Neutrophils Relative %: 56 %
Platelets: 391 10*3/uL (ref 150–400)
RBC: 4.33 MIL/uL (ref 3.87–5.11)
RDW: 12.6 % (ref 11.5–15.5)
Smear Review: NORMAL
WBC: 18.4 10*3/uL — ABNORMAL HIGH (ref 4.0–10.5)
nRBC: 0 % (ref 0.0–0.2)

## 2019-08-31 LAB — LIPID PANEL
Cholesterol: 246 mg/dL — ABNORMAL HIGH (ref 0–200)
HDL: 65 mg/dL (ref >40)
LDL Cholesterol: 121 mg/dL — ABNORMAL HIGH (ref 0–99)
Total CHOL/HDL Ratio: 3.8 RATIO
Triglycerides: 300 mg/dL — ABNORMAL HIGH (ref <150)
VLDL: 60 mg/dL — ABNORMAL HIGH (ref 0–40)

## 2019-08-31 LAB — URINALYSIS, COMPLETE (UACMP) WITH MICROSCOPIC
Bilirubin Urine: NEGATIVE
Glucose, UA: NEGATIVE mg/dL
Hgb urine dipstick: NEGATIVE
Ketones, ur: NEGATIVE mg/dL
Nitrite: NEGATIVE
Protein, ur: NEGATIVE mg/dL
Specific Gravity, Urine: 1.009 (ref 1.005–1.030)
pH: 7 (ref 5.0–8.0)

## 2019-08-31 LAB — APTT: aPTT: <24 seconds — ABNORMAL LOW (ref 24–36)

## 2019-08-31 LAB — COMPREHENSIVE METABOLIC PANEL
ALT: 30 U/L (ref 0–44)
AST: 23 U/L (ref 15–41)
Albumin: 3.3 g/dL — ABNORMAL LOW (ref 3.5–5.0)
Alkaline Phosphatase: 56 U/L (ref 38–126)
Anion gap: 6 (ref 5–15)
BUN: 22 mg/dL (ref 8–23)
CO2: 23 mmol/L (ref 22–32)
Calcium: >15 mg/dL (ref 8.9–10.3)
Chloride: 113 mmol/L — ABNORMAL HIGH (ref 98–111)
Creatinine, Ser: 1.2 mg/dL — ABNORMAL HIGH (ref 0.44–1.00)
GFR calc Af Amer: 55 mL/min — ABNORMAL LOW (ref >60)
GFR calc non Af Amer: 48 mL/min — ABNORMAL LOW (ref >60)
Glucose, Bld: 170 mg/dL — ABNORMAL HIGH (ref 70–99)
Potassium: >7.5 mmol/L (ref 3.5–5.1)
Sodium: 142 mmol/L (ref 135–145)
Total Bilirubin: 0.6 mg/dL (ref 0.3–1.2)
Total Protein: 7 g/dL (ref 6.5–8.1)

## 2019-08-31 LAB — PROTIME-INR
INR: 1 (ref 0.8–1.2)
Prothrombin Time: 13 seconds (ref 11.4–15.2)

## 2019-08-31 LAB — LACTIC ACID, PLASMA: Lactic Acid, Venous: 3.7 mmol/L (ref 0.5–1.9)

## 2019-08-31 LAB — ACETAMINOPHEN LEVEL: Acetaminophen (Tylenol), Serum: 13 ug/mL (ref 10–30)

## 2019-08-31 LAB — AMMONIA: Ammonia: <9 umol/L — ABNORMAL LOW (ref 9–35)

## 2019-08-31 LAB — SARS CORONAVIRUS 2 BY RT PCR (HOSPITAL ORDER, PERFORMED IN ~~LOC~~ HOSPITAL LAB): SARS Coronavirus 2: NEGATIVE

## 2019-08-31 LAB — SALICYLATE LEVEL: Salicylate Lvl: <7 mg/dL (ref 2.8–30.0)

## 2019-08-31 LAB — GLUCOSE, CAPILLARY: Glucose-Capillary: 72 mg/dL (ref 70–99)

## 2019-08-31 LAB — TROPONIN I (HIGH SENSITIVITY): Troponin I (High Sensitivity): 21 ng/L — ABNORMAL HIGH (ref <18)

## 2019-08-31 MED ORDER — MORPHINE SULFATE (PF) 4 MG/ML IV SOLN
INTRAVENOUS | Status: AC
  Administered 2019-08-31: 14:00:00 4 mg via INTRAVENOUS
  Filled 2019-08-31: qty 1

## 2019-08-31 MED ORDER — SODIUM CHLORIDE 0.9 % IV BOLUS
1000.0000 mL | Freq: Once | INTRAVENOUS | Status: AC
  Administered 2019-08-31 (×2): 1000 mL via INTRAVENOUS

## 2019-08-31 MED ORDER — FENTANYL CITRATE (PF) 100 MCG/2ML IJ SOLN
25.0000 ug | Freq: Once | INTRAMUSCULAR | Status: AC
  Administered 2019-08-31: 25 ug via INTRAVENOUS

## 2019-08-31 MED ORDER — SODIUM ZIRCONIUM CYCLOSILICATE 10 G PO PACK
10.0000 g | PACK | Freq: Three times a day (TID) | ORAL | Status: DC
  Filled 2019-08-31 (×2): qty 1

## 2019-08-31 MED ORDER — SODIUM CHLORIDE 0.9 % IV BOLUS
1000.0000 mL | Freq: Once | INTRAVENOUS | Status: AC
  Administered 2019-08-31: 12:00:00 1000 mL via INTRAVENOUS

## 2019-08-31 MED ORDER — FENTANYL CITRATE (PF) 100 MCG/2ML IJ SOLN
25.0000 ug | Freq: Once | INTRAMUSCULAR | Status: AC
  Administered 2019-08-31: 12:00:00 25 ug via INTRAVENOUS

## 2019-08-31 MED ORDER — SODIUM BICARBONATE 8.4 % IV SOLN
50.0000 meq | Freq: Once | INTRAVENOUS | Status: AC
  Administered 2019-08-31: 50 meq via INTRAVENOUS

## 2019-08-31 MED ORDER — FUROSEMIDE 10 MG/ML IJ SOLN
40.0000 mg | Freq: Once | INTRAMUSCULAR | Status: AC
  Administered 2019-08-31: 40 mg via INTRAVENOUS
  Filled 2019-08-31: qty 4

## 2019-08-31 MED ORDER — CALCIUM CHLORIDE 10 % IV SOLN
INTRAVENOUS | Status: AC
  Administered 2019-08-31: 13:00:00 1 g via INTRAVENOUS
  Filled 2019-08-31: qty 20

## 2019-08-31 MED ORDER — METHYLNALTREXONE BROMIDE 12 MG/0.6ML ~~LOC~~ SOLN
12.0000 mg | Freq: Once | SUBCUTANEOUS | Status: AC
  Administered 2019-08-31: 13:00:00 12 mg via SUBCUTANEOUS
  Filled 2019-08-31: qty 0.6

## 2019-08-31 MED ORDER — CALCIUM GLUCONATE 10 % IV SOLN: 2.0000 g | Freq: Once | INTRAVENOUS | Status: DC

## 2019-08-31 MED ORDER — LORAZEPAM 2 MG/ML IJ SOLN
1.0000 mg | Freq: Once | INTRAMUSCULAR | Status: AC
  Administered 2019-08-31: 14:00:00 1 mg via INTRAVENOUS

## 2019-08-31 MED ORDER — CALCIUM CHLORIDE 10 % IV SOLN
1.0000 g | Freq: Once | INTRAVENOUS | Status: AC
  Administered 2019-08-31: 13:00:00 1 g via INTRAVENOUS

## 2019-08-31 MED ORDER — CALCIUM CHLORIDE 10 % IV SOLN
INTRAVENOUS | Status: AC | PRN
  Administered 2019-08-31: 1 g via INTRAVENOUS

## 2019-08-31 MED ORDER — STERILE WATER FOR INJECTION IV SOLN
INTRAVENOUS | Status: DC
  Administered 2019-08-31: 13:00:00 via INTRAVENOUS
  Filled 2019-08-31 (×4): qty 850

## 2019-08-31 MED ORDER — SODIUM BICARBONATE 8.4 % IV SOLN
INTRAVENOUS | Status: AC | PRN
  Administered 2019-08-31: 50 meq via INTRAVENOUS

## 2019-08-31 MED ORDER — LORAZEPAM 2 MG/ML IJ SOLN
INTRAMUSCULAR | Status: AC
  Filled 2019-08-31: qty 1

## 2019-08-31 MED ORDER — FUROSEMIDE 10 MG/ML IJ SOLN
10.0000 mg/h | INTRAVENOUS | Status: DC
  Filled 2019-08-31: qty 25

## 2019-08-31 MED ORDER — FENTANYL CITRATE (PF) 100 MCG/2ML IJ SOLN
INTRAMUSCULAR | Status: AC
  Filled 2019-08-31: qty 2

## 2019-08-31 MED ORDER — MORPHINE SULFATE (PF) 4 MG/ML IV SOLN
4.0000 mg | Freq: Once | INTRAVENOUS | Status: AC
  Administered 2019-08-31: 14:00:00 4 mg via INTRAVENOUS

## 2019-09-02 LAB — URINE CULTURE: Culture: >=100000 — AB

## 2019-09-04 ENCOUNTER — Encounter: Payer: PRIVATE HEALTH INSURANCE | Admitting: Obstetrics & Gynecology

## 2019-09-29 NOTE — ED Notes (Signed)
Patient appears agittated, pulling and IVs and foley. Patient unable to follow commands and having to have hands held by staff. Soft mitts placed on bilateral hands. Verbal order given for IV ativan.

## 2019-09-29 NOTE — ED Notes (Signed)
Patient's husband in room with patient at this time

## 2019-09-29 NOTE — ED Notes (Signed)
Patient in sustained vtach on monitor. MD aware and verbal order given calcium chloride and sodium bicarb

## 2019-09-29 NOTE — ED Notes (Signed)
Patient with no pulse and no cardiac activity noted on monitor. MD at bedside. Time of death called [id number]. Husband remains at bedside. Asking for time alone with patient.

## 2019-09-29 NOTE — ED Notes (Signed)
Patient placed in body bag. Lines and drains left in place per ME. No belongings with patients. Transport made aware.

## 2019-09-29 NOTE — ED Notes (Signed)
MD and husband in agreement to stop infusions and withdraw care, providing comfort care only. Verbal order given for IV morphine. Husband and Felicia, NT remain at bedside with patient.

## 2019-09-29 NOTE — ED Triage Notes (Addendum)
Patient arrives from home via [facility]. Per EMS, patient was found by family in floor after using restroom this morning. Per EMS, patient was alert and oriented sitting on commode upon their arrival. Patient complaining of generalized pain upon entering ambulance which is baseline per family. En route to ED, patient became unresponsive for EMS and bradycardic. Patient then read asystole on EKG. IO placed by EMS and 1 g calcium chloride given through IO. Spontaneous return of pulses and respirations after administration. Upon arrival to ED, patient is sinus rhythm with PVCs. Patient unresponsive initially. Given 1 g calcium chloride and 50 mEq sodium bicarb. After administration, patient responsive to painful stimuli, with moaning and hollering out. MD and RT at bedside. Patient is DNR.

## 2019-09-29 NOTE — ED Notes (Signed)
Flexi-seal placed by this RN and Val, Therapist, sports. Balloon inflated with 40 ml fluid.

## 2019-09-29 NOTE — ED Provider Notes (Addendum)
[facility] Emergency Department Provider Note  ____________________________________________  Time seen: Approximately 11:20 AM  I have reviewed the triage vital signs and the nursing notes.   HISTORY  Chief Complaint Unresponsive    Level 5 Caveat: Portions of the History and Physical including HPI and review of systems are unable to be completely obtained due to patient being a poor historian   HPI Ashlee Hanson is a 63 y.o. female with a history of lupus and brain mass and depression who patient presents with altered mental status, found unresponsive at home.  During transport, initial wide-complex rhythm progressed to PEA/ventricular escape with a rate of about 20.  Due to DNR status, she did not receive CPR.  EMS called the ED for online medical control, I recommended giving IV calcium as soon as possible.  They placed an IO and gave calcium after which patient had improvement in her heart rate to about 100 bpm, narrowing of her QRS.  They arrived in the ED with patient comatose.  Unknown downtime at home.  Reviewing the EMS monitor strip she was in PEA for about 3 minutes during  transport     Past Medical History:  Diagnosis Date  . Anxiety   . Arthritis   . Depression   . Depression   . Dry eye syndrome   . Fibromyalgia   . Headache 02/03/2018  . Hypertension   . Lupus ([place])   . Partial symptomatic epilepsy with complex partial seizures, not intractable, without status epilepticus ([place]) 02/03/2018  . Rheumatoid arthritis ([place])   . Symptomatic partial epilepsy with intractable complex partial seizures ([place])   . Thyroid disease      Patient Active Problem List   Diagnosis Date Noted  . Partial symptomatic epilepsy with complex partial seizures, not intractable, without status epilepticus ([place]) 02/03/2018  . Headache 02/03/2018  . Pain in limb 12/07/2013     Past Surgical History:  Procedure Laterality Date  . ABDOMINAL HYSTERECTOMY    .  BACK SURGERY    . BRAIN SURGERY    . CATARACT EXTRACTION W/ INTRAOCULAR LENS IMPLANT Bilateral   . CESAREAN SECTION    . 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 EYE SURGERY     cataract removal  . hemorrhoidopexy  03/25/2013  . THYROID SURGERY     removal  . THYROIDECTOMY    . urethal diverticulum removal       Prior to Admission medications   Medication Sig Start Date End Date Taking? Authorizing Provider  acetaminophen (TYLENOL) 500 MG tablet Take 1,000 mg by mouth as needed.   Yes [provider]  ALPRAZolam Duanne Moron) 0.5 MG tablet Take 0.5 mg by mouth 3 (three) times daily as needed for anxiety.   Yes [provider]  Calcium 500 MG tablet Take 1,000 mg by mouth.   Yes [provider]  Cranberry 500 MG CAPS Take 500 mg by mouth 2 (two) times daily.    Yes [provider]  Cyanocobalamin (B12 LIQUID HEALTH BOOSTER PO) Take 5,000 mcg by mouth daily.    Yes [provider]  Hyaluronic Acid-Vitamin C (HYALURONIC ACID PO) Take by mouth 3 (three) times daily with meals.   Yes [provider]  levothyroxine (SYNTHROID, LEVOTHROID) 112 MCG tablet Take 112 mcg by mouth daily before breakfast. Takes 1 tablet on Monday, Wednesday, Friday, Saturday, Sunday. Takes 1/2 tab on Tuesday, Thursday   Yes [provider]  Lifitegrast Shirley Friar) 5 %  SOLN Apply 1 Dose to eye.   Yes [provider]  magnesium gluconate (MAGONATE) 500 MG tablet Take 500 mg by mouth at bedtime.   Yes [provider]  Magnesium Malate 1250 (141.7 Mg) MG TABS Take by mouth 3 (three) times daily.   Yes [provider]  Multiple Vitamins-Minerals (MULTIVITAMIN PO) Take 0.5 tablets by mouth daily. Senior   Yes [provider]  oxyCODONE (OXY IR/ROXICODONE) 5 MG immediate release tablet Take 5 mg by mouth daily as needed for severe pain.   Yes [provider]  Potassium 99 MG TABS Take 2 tablets by mouth 2 (two)  times daily.   Yes [provider]  predniSONE (DELTASONE) 5 MG tablet Take 5 mg by mouth daily with breakfast.   Yes [provider]  Probiotic Product (PROBIOTIC PO) Take 1 Dose by mouth daily. 90 billion   Yes [provider]  propranolol (INDERAL) 20 MG tablet Take 20 mg by mouth 2 (two) times daily.   Yes [provider]  sodium chloride (MURO 128) 2 % ophthalmic solution 1 drop at bedtime.   Yes [provider]  Cholecalciferol (VITAMIN D3 PO) Take 1,000 Units by mouth daily.    [provider]  divalproex (DEPAKOTE) 125 MG DR tablet One capsule twice a day for 2 weeks, then take 2 capsules twice a day Patient not taking: Reported on 09-19-2019 02/03/18   Kathrynn Ducking, MD     Allergies Citalopram, Conjugated estrogens, Duloxetine, Duloxetine hcl, Estradiol, Gabapentin, Levothyroxine, Nexium [esomeprazole magnesium], Nsaids, Peanut oil, Rofecoxib, Tape, Arava [leflunomide], Cephalosporins, Ciprofloxacin, Esomeprazole, Humira [adalimumab], Lyrica [pregabalin], Omeprazole, Phenytoin, Quinolones, Tramadol, Benadryl [diphenhydramine], Cefuroxime axetil, Celexa [citalopram hydrobromide], Codeine phosphate, Desipramine, Dilantin [phenytoin sodium extended], Escitalopram, Escitalopram oxalate, Estrogens, Famciclovir, Ganciclovir, Hydroxychloroquine, Levaquin [levofloxacin in d5w], Levofloxacin, Metanx [l-methylfolate-algae-b12-b6], Penicillins, Septra [sulfamethoxazole-trimethoprim], Sulfa antibiotics, Sulfamethoxazole-trimethoprim, Synthroid [levothyroxine sodium], Tolmetin, Acyclovir, Acyclovir and related, Almond oil, Baclofen, Cefuroxime, Cephalexin, Ciprofloxacin hcl, Codeine, Erythromycin, and Sulfamethoxazole   Family History  Problem Relation Age of Onset  . Stroke Mother   . Hyperlipidemia Mother   . Hypertension Mother   . Stroke Father   . Hypertension Father   . Peripheral vascular disease Father     Social History Social  History   Tobacco Use  . Smoking status: Former Smoker    Types: Cigarettes    Quit date: 12/08/2007    Years since quitting: 11.7  . Smokeless tobacco: Never Used  Substance Use Topics  . Alcohol use: No  . Drug use: No    Review of Systems Level 5 Caveat: Portions of the History and Physical including HPI and review of systems are unable to be completely obtained due to patient being a poor historian   Constitutional:   No known fever.  ENT:   No rhinorrhea. Cardiovascular:   No chest pain or syncope. Respiratory:   No dyspnea or cough. Gastrointestinal:   Negative for abdominal pain, vomiting and diarrhea.  Musculoskeletal:   Negative for focal pain or swelling ____________________________________________   PHYSICAL EXAM:  VITAL SIGNS: ED Triage Vitals  Enc Vitals Group     BP      Pulse      Resp      Temp      Temp src      SpO2      Weight      Height      Head Circumference      Peak Flow  Pain Score      Pain Loc      Pain Edu?      Excl. in [place]?     Vital signs reviewed, nursing assessments reviewed.   Constitutional:   Comatose, ill-appearing Eyes:   Conjunctivae are normal. EOM untestable. PERRL at 3 mm bilaterally. ENT      Head:   Normocephalic and atraumatic.      Nose:   No congestion/rhinnorhea.       Mouth/Throat:   MMM, no pharyngeal erythema. No peritonsillar mass.       Neck:   No meningismus. Full ROM. Hematological/Lymphatic/Immunilogical:   No cervical lymphadenopathy. Cardiovascular:   Irregular rhythm, tachycardia heart rate 100. Symmetric bilateral thready radial and DP pulses.  No murmurs. Cap refill approximately 6 seconds Respiratory:   Normal respiratory effort without tachypnea/retractions. Breath sounds are clear and equal bilaterally. No wheezes/rales/rhonchi. Gastrointestinal:   Soft and nontender. Non distended. There is no CVA tenderness.  No rebound, rigidity, or guarding.  Brown liquid stool, Hemoccult  negative Genitourinary:   Normal Musculoskeletal:   Normal range of motion in all extremities. No joint effusions.  No lower extremity tenderness.  No edema. Neurologic:   GCS = E1V2M3 = 6 Skin:    Skin is cool and dry with mottling. No rash noted.  No petechiae, purpura, or bullae.  ____________________________________________    LABS (pertinent positives/negatives) (all labs ordered are listed, but only abnormal results are displayed) Labs Reviewed  CBC WITH DIFFERENTIAL/PLATELET - Abnormal; Notable for the following components:      Result Value   WBC 18.4 (*)    MCV 101.6 (*)    Neutro Abs 10.4 (*)    Lymphs Abs 6.1 (*)    Eosinophils Absolute 0.9 (*)    Abs Immature Granulocytes 0.34 (*)    All other components within normal limits  APTT - Abnormal; Notable for the following components:   aPTT <24 (*)    All other components within normal limits  COMPREHENSIVE METABOLIC PANEL - Abnormal; Notable for the following components:   Potassium >7.5 (*)    Chloride 113 (*)    Glucose, Bld 170 (*)    Creatinine, Ser 1.20 (*)    Calcium >15.0 (*)    Albumin 3.3 (*)    GFR calc non Af Amer 48 (*)    GFR calc Af Amer 55 (*)    All other components within normal limits  LIPID PANEL - Abnormal; Notable for the following components:   Cholesterol 246 (*)    Triglycerides 300 (*)    VLDL 60 (*)    LDL Cholesterol 121 (*)    All other components within normal limits  LACTIC ACID, PLASMA - Abnormal; Notable for the following components:   Lactic Acid, Venous 3.7 (*)    All other components within normal limits  URINALYSIS, COMPLETE (UACMP) WITH MICROSCOPIC - Abnormal; Notable for the following components:   Color, Urine STRAW (*)    APPearance CLEAR (*)    Leukocytes,Ua SMALL (*)    Bacteria, UA RARE (*)    All other components within normal limits  URINE DRUG SCREEN, QUALITATIVE ([facility] ONLY) - Abnormal; Notable for the following components:   Benzodiazepine, Ur Scrn POSITIVE (*)     All other components within normal limits  AMMONIA - Abnormal; Notable for the following components:   Ammonia <9 (*)    All other components within normal limits  TROPONIN I (HIGH SENSITIVITY) - Abnormal; Notable for the following  components:   Troponin I (High Sensitivity) 21 (*)    All other components within normal limits  SARS CORONAVIRUS 2 BY RT PCR (HOSPITAL ORDER, [place] [facility])  URINE CULTURE  GLUCOSE, CAPILLARY  PROTIME-INR  SALICYLATE LEVEL  ACETAMINOPHEN LEVEL  LACTIC ACID, PLASMA  TROPONIN I (HIGH SENSITIVITY)   ____________________________________________   EKG  Interpreted by me Sinus rhythm rate of 62, right axis, normal intervals.  Normal QRS with peaked T waves diffusely, anterior ST elevation of 1 to 2 mm.  Concerning for STEMI  Prehospital EKG transmitted by EMS at 10:45 AM shows widened QRS with anterior ST elevation, irregular rhythm  ____________________________________________    RADIOLOGY  Dg Chest Port 1 View  Result Date: Sep 02, 2019 CLINICAL DATA:  Cardiac arrest EXAM: PORTABLE CHEST 1 VIEW COMPARISON:  September 19, 2017 FINDINGS: There is atelectatic change in the left base. The lungs elsewhere are clear. Heart size and pulmonary vascularity are normal. No adenopathy. No bone lesions. There are multiple opacities either in or overlying the proximal stomach. Question multiple pills. IMPRESSION: Left base atelectasis.  Lungs elsewhere clear.  Heart size normal. Multiple radiopaque foreign bodies either in or overlying the stomach. Question pills within the stomach. Electronically Signed   By: Lowella Grip III M.D.   On: Sep 02, 2019 12:19    ____________________________________________   PROCEDURES .Critical Care Performed by: Carrie Mew, MD Authorized by: Carrie Mew, MD   Critical care provider statement:    Critical care time (minutes):  80   Critical care time was exclusive of:  Separately billable  procedures and treating other patients   Critical care was necessary to treat or prevent imminent or life-threatening deterioration of the following conditions:  Cardiac failure, metabolic crisis, circulatory failure, CNS failure or compromise and shock   Critical care was time spent personally by me on the following activities:  Development of treatment plan with patient or surrogate, discussions with consultants, evaluation of patient's response to treatment, examination of patient, obtaining history from patient or surrogate, ordering and performing treatments and interventions, ordering and review of laboratory studies, ordering and review of radiographic studies, pulse oximetry, re-evaluation of patient's condition and review of old charts    ____________________________________________  DIFFERENTIAL DIAGNOSIS  Hyperkalemia, electrolyte abnormality, dehydration, intracranial hemorrhage, STEMI/ACS  CLINICAL IMPRESSION / ASSESSMENT AND PLAN / ED COURSE  Medications ordered in the ED: Medications  sodium bicarbonate 150 mEq in sterile water 1,000 mL infusion ( Intravenous New Bag/Given Sep 02, 2019 1317)  sodium zirconium cyclosilicate (LOKELMA) packet 10 g (has no administration in time range)  furosemide (LASIX) 250 mg in dextrose 5 % 250 mL (1 mg/mL) infusion (has no administration in time range)  morphine 4 MG/ML injection 4 mg (has no administration in time range)  morphine 4 MG/ML injection (has no administration in time range)  calcium chloride injection (1 g Intravenous Given 09/02/19 1105)  sodium bicarbonate injection (50 mEq Intravenous Given September 02, 2019 1106)  sodium chloride 0.9 % bolus 1,000 mL (0 mLs Intravenous Stopped 09-02-2019 1302)  fentaNYL (SUBLIMAZE) injection 25 mcg (25 mcg Intravenous Given Sep 02, 2019 1149)  fentaNYL (SUBLIMAZE) injection 25 mcg (25 mcg Intravenous Given 09/02/19 1210)  sodium chloride 0.9 % bolus 1,000 mL (1,000 mLs Intravenous New Bag/Given 2019-09-02 1326)   furosemide (LASIX) injection 40 mg (40 mg Intravenous Given 09-02-2019 1302)  methylnaltrexone (RELISTOR) injection 12 mg (12 mg Subcutaneous Given 09/02/19 1318)  sodium bicarbonate injection 50 mEq (50 mEq Intravenous Given 2019-09-02 1300)  calcium chloride injection 1  g (1 g Intravenous Given 09/08/2019 1300)  LORazepam (ATIVAN) injection 1 mg (1 mg Intravenous Given 08-Sep-2019 1347)    Pertinent labs & imaging results that were available during my care of the patient were reviewed by me and considered in my medical decision making (see chart for details).   JADEA BIBBEE was evaluated in Emergency Department on 2019-09-08 for the symptoms described in the history of present illness. She was evaluated in the context of the global COVID-19 pandemic, which necessitated consideration that the patient might be at risk for infection with the SARS-CoV-2 virus that causes COVID-19. Institutional protocols and algorithms that pertain to the evaluation of patients at risk for COVID-19 are in a state of rapid change based on information released by regulatory bodies including the CDC and federal and state organizations. These policies and algorithms were followed during the patient's care in the ED.   Patient presents unresponsive with abnormal EKG concerning for toxicity versus severe electrolyte abnormality versus STEMI.  She has a DNR with documentation accompanying her.  After IO calcium chloride and sodium bicarbonate, rhythm appears improved, narrow QRS, and patient now moaning and moving upper extremities spontaneously.  Will await cardiology input, send lab panel chest x-ray.  Presentation is not due to an infection or sepsis, but due to acute electrolyte abnormality.  Clinical Course as of Aug 31 1507  Mon Aug 31, 2019  1131 Dr. Fletcher Anon arrived at bedside, discussed the case with him, he agrees this is likely hyperkalemia, cancels code STEMI for now.   [PS]  [id number] Labs confirm severe hyperkalemia.  Lactate  elevated to 3.7.  She is not septic and I do not suspect infection in fact, her chest x-ray shows a large amount of radiopaque objects in her stomach.  I had a long conversation with her husband who confirms that she does take a potassium supplement.  I explained that I suspect she has made a suicide attempt by overdosing on potassium.  He states that she signed a DNR order due to her chronic pain which has been attributed to osteoarthritis by her doctors.  Despite the apparent suicide attempt, he states that he wishes to honor the DNR, and would refuse dialysis and stomach tube for decompression of remaining potassium tablets and requested the patient be placed under comfort care, understanding that she will likely die soon.  He reiterates that she has been suffering with severe chronic pain which has been unmanageable and she is made very clear over the course of a few years that she does not wish to live under the circumstances.   [PS]  [id number] I discussed with Marylen Ponto of the hospital ethics committee given the DNR status, surrogate decision-maker refusing care on her behalf for an apparent suicide attempt that is treatable and reversible, but likely to result in death imminently if untreated.   [PS]  [id number] High calcium due to IV/intraosseous calcium administration for reversal of her hyperkalemia.  Calcium(!!): >15.0 [PS]  1258 On the monitor, heart rate was becoming irregular and QRS widening again.  Patient was given a gram of calcium chloride and an amp of sodium bicarbonate with normalization of QRS, persistent severely peaked T waves.  Awaiting ethics committee input.  Ordered additional liter of saline, IV Lasix, bicarbonate drip.   [PS]  [id number] Received a call back from Marylen Ponto, hospital ethics committee, who advises that with DNR in place and husband is surrogate decision-maker, and no documented alternative HC POA, we will honor current  directives including DNR, no dialysis, no orogastric  tube placement.  Patient already has Foley and Flexi-Seal for management of outputs.  I will continue IV fluids, diuretics for potassium wasting, low, p.o. if able, admit to hospital.  Ethics also advises that if husband chooses to pursue comfort care, we would comply with that choice as well.   [PS]    Clinical Course User Index [PS] Carrie Mew, MD    ----------------------------------------- 2:19 PM on September 27, 2019 -----------------------------------------  Husband at bedside.  Discussed with hospitalist for further management.  Patient having evidence of recurrent cardiac instability, likely impending cardiac arrest.  Husband confirms he wishes to continue with comfort care and expectant management.   ----------------------------------------- 2:57 PM on 09-27-2019 -----------------------------------------  Patient noted to be in ventricular fibrillation on the monitor at 2:30pm.  Went to the room, and this rapidly progressed to asystole.  No spontaneous respirations, no pulse, no heart sounds on auscultation.  No corneal reflex.  Patient pronounced dead at 2:35 PM with husband at bedside.  ____________________________________________   FINAL CLINICAL IMPRESSION(S) / ED DIAGNOSES    Final diagnoses:  Suicide attempt by substance overdose, initial encounter [facility])  Hyperkalemia  Cardiac arrest [facility])     ED Discharge Orders    None      Portions of this note were generated with dragon dictation software. Dictation errors may occur despite best attempts at proofreading.   Carrie Mew, MD September 27, 2019 1420    Carrie Mew, MD 09/27/2019 1508    Carrie Mew, MD 09/01/19 [id number]

## 2019-09-29 DEATH — deceased

## 2021-09-21 IMAGING — DX DG CHEST 1V PORT
1 series · 1 of 1 positions shown · non-contrast
Comparison: September 19, 2017

CLINICAL DATA: Cardiac arrest

EXAM:
PORTABLE CHEST 1 VIEW

[chest ap]
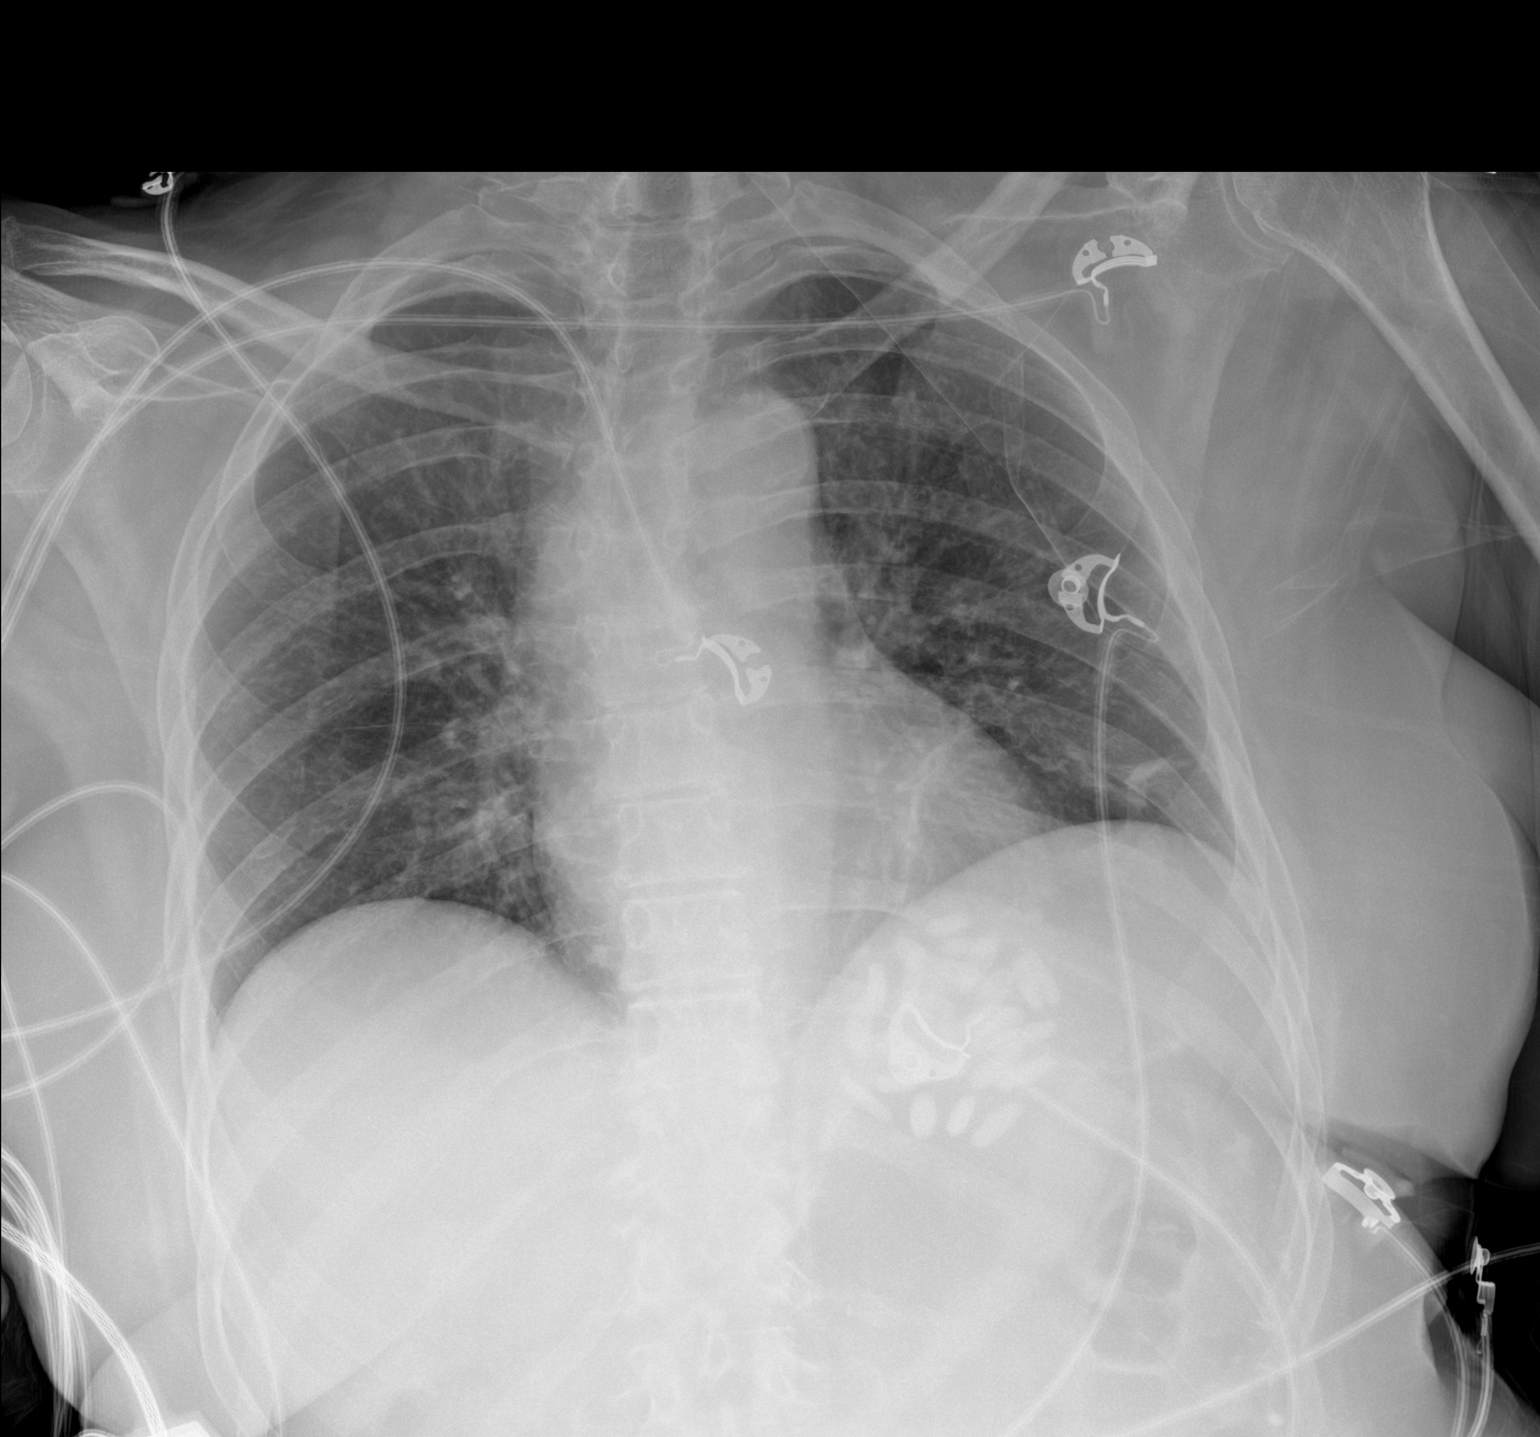

[1 of 1 positions shown; findings below may reference images not displayed]

FINDINGS: There is atelectatic change in the left base. The lungs elsewhere
are clear. Heart size and pulmonary vascularity are normal. No
adenopathy. No bone lesions. There are multiple opacities either in
or overlying the proximal stomach. Question multiple pills.
IMPRESSION: Left base atelectasis.  Lungs elsewhere clear.  Heart size normal.

Multiple radiopaque foreign bodies either in or overlying the
stomach. Question pills within the stomach.
